# Patient Record
Sex: Male | Born: 1935 | Race: White | Hispanic: No | Smoking: Never smoker
Health system: Southern US, Community
[De-identification: ages and names within clinical notes are randomized; demographics above are authoritative.]

## PROBLEM LIST (undated history)

## (undated) DIAGNOSIS — I219 Acute myocardial infarction, unspecified: Secondary | ICD-10-CM

## (undated) DIAGNOSIS — E785 Hyperlipidemia, unspecified: Secondary | ICD-10-CM

## (undated) DIAGNOSIS — Z8042 Family history of malignant neoplasm of prostate: Secondary | ICD-10-CM

## (undated) DIAGNOSIS — Z951 Presence of aortocoronary bypass graft: Secondary | ICD-10-CM

## (undated) DIAGNOSIS — G4730 Sleep apnea, unspecified: Secondary | ICD-10-CM

## (undated) DIAGNOSIS — J449 Chronic obstructive pulmonary disease, unspecified: Secondary | ICD-10-CM

## (undated) DIAGNOSIS — C140 Malignant neoplasm of pharynx, unspecified: Secondary | ICD-10-CM

## (undated) HISTORY — PX: ESOPHAGUS SURGERY: SHX626

## (undated) HISTORY — PX: BACK SURGERY: SHX140

## (undated) HISTORY — DX: Malignant neoplasm of pharynx, unspecified: C14.0

## (undated) HISTORY — PX: CORONARY ARTERY BYPASS GRAFT: SHX141

## (undated) HISTORY — DX: Hyperlipidemia, unspecified: E78.5

## (undated) HISTORY — DX: Acute myocardial infarction, unspecified: I21.9

## (undated) HISTORY — DX: Chronic obstructive pulmonary disease, unspecified: J44.9

## (undated) HISTORY — PX: OTHER SURGICAL HISTORY: SHX169

## (undated) HISTORY — DX: Family history of malignant neoplasm of prostate: Z80.42

## (undated) HISTORY — DX: Sleep apnea, unspecified: G47.30

## (undated) HISTORY — DX: Presence of aortocoronary bypass graft: Z95.1

---

## 1999-09-03 ENCOUNTER — Ambulatory Visit (HOSPITAL_COMMUNITY): Admission: RE | Admit: 1999-09-03 | Discharge: 1999-09-03 | Payer: Self-pay | Admitting: Cardiology

## 2002-05-20 ENCOUNTER — Ambulatory Visit (HOSPITAL_BASED_OUTPATIENT_CLINIC_OR_DEPARTMENT_OTHER): Admission: RE | Admit: 2002-05-20 | Discharge: 2002-05-20 | Payer: Self-pay | Admitting: Neurology

## 2002-08-03 ENCOUNTER — Ambulatory Visit (HOSPITAL_BASED_OUTPATIENT_CLINIC_OR_DEPARTMENT_OTHER): Admission: RE | Admit: 2002-08-03 | Discharge: 2002-08-03 | Payer: Self-pay | Admitting: Neurology

## 2002-12-03 ENCOUNTER — Ambulatory Visit (HOSPITAL_COMMUNITY): Admission: RE | Admit: 2002-12-03 | Discharge: 2002-12-03 | Payer: Self-pay | Admitting: *Deleted

## 2002-12-03 ENCOUNTER — Encounter (INDEPENDENT_AMBULATORY_CARE_PROVIDER_SITE_OTHER): Payer: Self-pay | Admitting: Specialist

## 2003-12-15 ENCOUNTER — Ambulatory Visit (HOSPITAL_COMMUNITY): Admission: RE | Admit: 2003-12-15 | Discharge: 2003-12-15 | Payer: Self-pay | Admitting: *Deleted

## 2003-12-15 ENCOUNTER — Encounter (INDEPENDENT_AMBULATORY_CARE_PROVIDER_SITE_OTHER): Payer: Self-pay | Admitting: *Deleted

## 2004-06-30 ENCOUNTER — Encounter
Admission: RE | Admit: 2004-06-30 | Discharge: 2004-06-30 | Payer: Self-pay | Admitting: Physical Medicine and Rehabilitation

## 2004-08-22 ENCOUNTER — Inpatient Hospital Stay (HOSPITAL_COMMUNITY): Admission: RE | Admit: 2004-08-22 | Discharge: 2004-08-23 | Payer: Self-pay | Admitting: Orthopaedic Surgery

## 2006-01-22 ENCOUNTER — Ambulatory Visit (HOSPITAL_COMMUNITY): Admission: RE | Admit: 2006-01-22 | Discharge: 2006-01-22 | Payer: Self-pay | Admitting: *Deleted

## 2006-01-22 ENCOUNTER — Encounter (INDEPENDENT_AMBULATORY_CARE_PROVIDER_SITE_OTHER): Payer: Self-pay | Admitting: Specialist

## 2006-07-09 ENCOUNTER — Ambulatory Visit (HOSPITAL_COMMUNITY): Admission: RE | Admit: 2006-07-09 | Discharge: 2006-07-09 | Payer: Self-pay | Admitting: *Deleted

## 2006-07-09 ENCOUNTER — Encounter (INDEPENDENT_AMBULATORY_CARE_PROVIDER_SITE_OTHER): Payer: Self-pay | Admitting: Specialist

## 2006-07-25 ENCOUNTER — Encounter: Admission: RE | Admit: 2006-07-25 | Discharge: 2006-07-25 | Payer: Self-pay | Admitting: *Deleted

## 2006-07-31 ENCOUNTER — Ambulatory Visit (HOSPITAL_COMMUNITY): Admission: RE | Admit: 2006-07-31 | Discharge: 2006-07-31 | Payer: Self-pay | Admitting: Gastroenterology

## 2006-08-04 ENCOUNTER — Ambulatory Visit: Payer: Self-pay | Admitting: Gastroenterology

## 2006-08-05 ENCOUNTER — Ambulatory Visit: Payer: Self-pay | Admitting: Thoracic Surgery

## 2006-08-19 ENCOUNTER — Ambulatory Visit: Admission: RE | Admit: 2006-08-19 | Discharge: 2006-08-19 | Payer: Self-pay | Admitting: Thoracic Surgery

## 2006-08-20 ENCOUNTER — Ambulatory Visit (HOSPITAL_COMMUNITY): Admission: RE | Admit: 2006-08-20 | Discharge: 2006-08-20 | Payer: Self-pay | Admitting: Thoracic Surgery

## 2006-08-20 ENCOUNTER — Ambulatory Visit: Payer: Self-pay | Admitting: Thoracic Surgery

## 2006-08-24 ENCOUNTER — Inpatient Hospital Stay (HOSPITAL_COMMUNITY): Admission: RE | Admit: 2006-08-24 | Discharge: 2006-09-23 | Payer: Self-pay | Admitting: Thoracic Surgery

## 2006-08-25 ENCOUNTER — Encounter (INDEPENDENT_AMBULATORY_CARE_PROVIDER_SITE_OTHER): Payer: Self-pay | Admitting: Specialist

## 2006-08-25 ENCOUNTER — Ambulatory Visit: Payer: Self-pay | Admitting: Thoracic Surgery

## 2006-09-01 ENCOUNTER — Encounter (INDEPENDENT_AMBULATORY_CARE_PROVIDER_SITE_OTHER): Payer: Self-pay | Admitting: Specialist

## 2006-10-09 ENCOUNTER — Ambulatory Visit: Payer: Self-pay | Admitting: Thoracic Surgery

## 2006-10-09 ENCOUNTER — Encounter: Admission: RE | Admit: 2006-10-09 | Discharge: 2006-10-09 | Payer: Self-pay | Admitting: Thoracic Surgery

## 2006-10-10 ENCOUNTER — Encounter: Admission: RE | Admit: 2006-10-10 | Discharge: 2006-10-10 | Payer: Self-pay | Admitting: Thoracic Surgery

## 2006-10-15 ENCOUNTER — Ambulatory Visit: Payer: Self-pay | Admitting: Thoracic Surgery

## 2006-11-04 ENCOUNTER — Ambulatory Visit: Payer: Self-pay | Admitting: Thoracic Surgery

## 2006-11-19 ENCOUNTER — Encounter: Admission: RE | Admit: 2006-11-19 | Discharge: 2006-11-19 | Payer: Self-pay | Admitting: *Deleted

## 2006-11-28 ENCOUNTER — Ambulatory Visit: Payer: Self-pay | Admitting: Thoracic Surgery

## 2006-11-28 ENCOUNTER — Encounter: Admission: RE | Admit: 2006-11-28 | Discharge: 2006-11-28 | Payer: Self-pay | Admitting: Thoracic Surgery

## 2006-12-03 ENCOUNTER — Ambulatory Visit (HOSPITAL_COMMUNITY): Admission: RE | Admit: 2006-12-03 | Discharge: 2006-12-03 | Payer: Self-pay | Admitting: *Deleted

## 2006-12-09 ENCOUNTER — Ambulatory Visit (HOSPITAL_COMMUNITY): Admission: RE | Admit: 2006-12-09 | Discharge: 2006-12-09 | Payer: Self-pay | Admitting: *Deleted

## 2006-12-09 ENCOUNTER — Encounter (INDEPENDENT_AMBULATORY_CARE_PROVIDER_SITE_OTHER): Payer: Self-pay | Admitting: *Deleted

## 2006-12-30 ENCOUNTER — Ambulatory Visit: Payer: Self-pay | Admitting: Thoracic Surgery

## 2007-03-10 ENCOUNTER — Ambulatory Visit: Payer: Self-pay | Admitting: Thoracic Surgery

## 2007-06-10 ENCOUNTER — Ambulatory Visit: Payer: Self-pay | Admitting: Thoracic Surgery

## 2007-06-10 ENCOUNTER — Encounter: Admission: RE | Admit: 2007-06-10 | Discharge: 2007-06-10 | Payer: Self-pay | Admitting: Thoracic Surgery

## 2007-06-11 ENCOUNTER — Inpatient Hospital Stay (HOSPITAL_COMMUNITY): Admission: EM | Admit: 2007-06-11 | Discharge: 2007-06-16 | Payer: Self-pay | Admitting: Emergency Medicine

## 2007-07-15 ENCOUNTER — Ambulatory Visit (HOSPITAL_BASED_OUTPATIENT_CLINIC_OR_DEPARTMENT_OTHER): Admission: RE | Admit: 2007-07-15 | Discharge: 2007-07-15 | Payer: Self-pay | Admitting: Urology

## 2007-12-08 ENCOUNTER — Encounter: Admission: RE | Admit: 2007-12-08 | Discharge: 2007-12-08 | Payer: Self-pay | Admitting: Thoracic Surgery

## 2007-12-08 ENCOUNTER — Ambulatory Visit: Payer: Self-pay | Admitting: Thoracic Surgery

## 2008-03-10 ENCOUNTER — Encounter (INDEPENDENT_AMBULATORY_CARE_PROVIDER_SITE_OTHER): Payer: Self-pay | Admitting: *Deleted

## 2008-03-10 ENCOUNTER — Ambulatory Visit (HOSPITAL_COMMUNITY): Admission: RE | Admit: 2008-03-10 | Discharge: 2008-03-10 | Payer: Self-pay | Admitting: *Deleted

## 2008-04-29 HISTORY — PX: HERNIA REPAIR: SHX51

## 2008-05-16 ENCOUNTER — Encounter (INDEPENDENT_AMBULATORY_CARE_PROVIDER_SITE_OTHER): Payer: Self-pay | Admitting: General Surgery

## 2008-05-16 ENCOUNTER — Ambulatory Visit (HOSPITAL_COMMUNITY): Admission: RE | Admit: 2008-05-16 | Discharge: 2008-05-17 | Payer: Self-pay | Admitting: General Surgery

## 2008-06-08 ENCOUNTER — Encounter: Admission: RE | Admit: 2008-06-08 | Discharge: 2008-06-08 | Payer: Self-pay | Admitting: Thoracic Surgery

## 2008-06-08 ENCOUNTER — Ambulatory Visit: Payer: Self-pay | Admitting: Thoracic Surgery

## 2008-12-07 ENCOUNTER — Encounter: Admission: RE | Admit: 2008-12-07 | Discharge: 2008-12-07 | Payer: Self-pay | Admitting: Thoracic Surgery

## 2008-12-07 ENCOUNTER — Ambulatory Visit: Payer: Self-pay | Admitting: Thoracic Surgery

## 2009-05-15 IMAGING — CR DG CHEST 1V PORT
1 series · 1 of 1 positions shown · non-contrast
Comparison: 09/10/06.

CLINICAL DATA: Esophageal carcinoma.  Status post surgery.
 PORTABLE CHEST - 1 VIEW - 09/11/06:

[view not recorded]
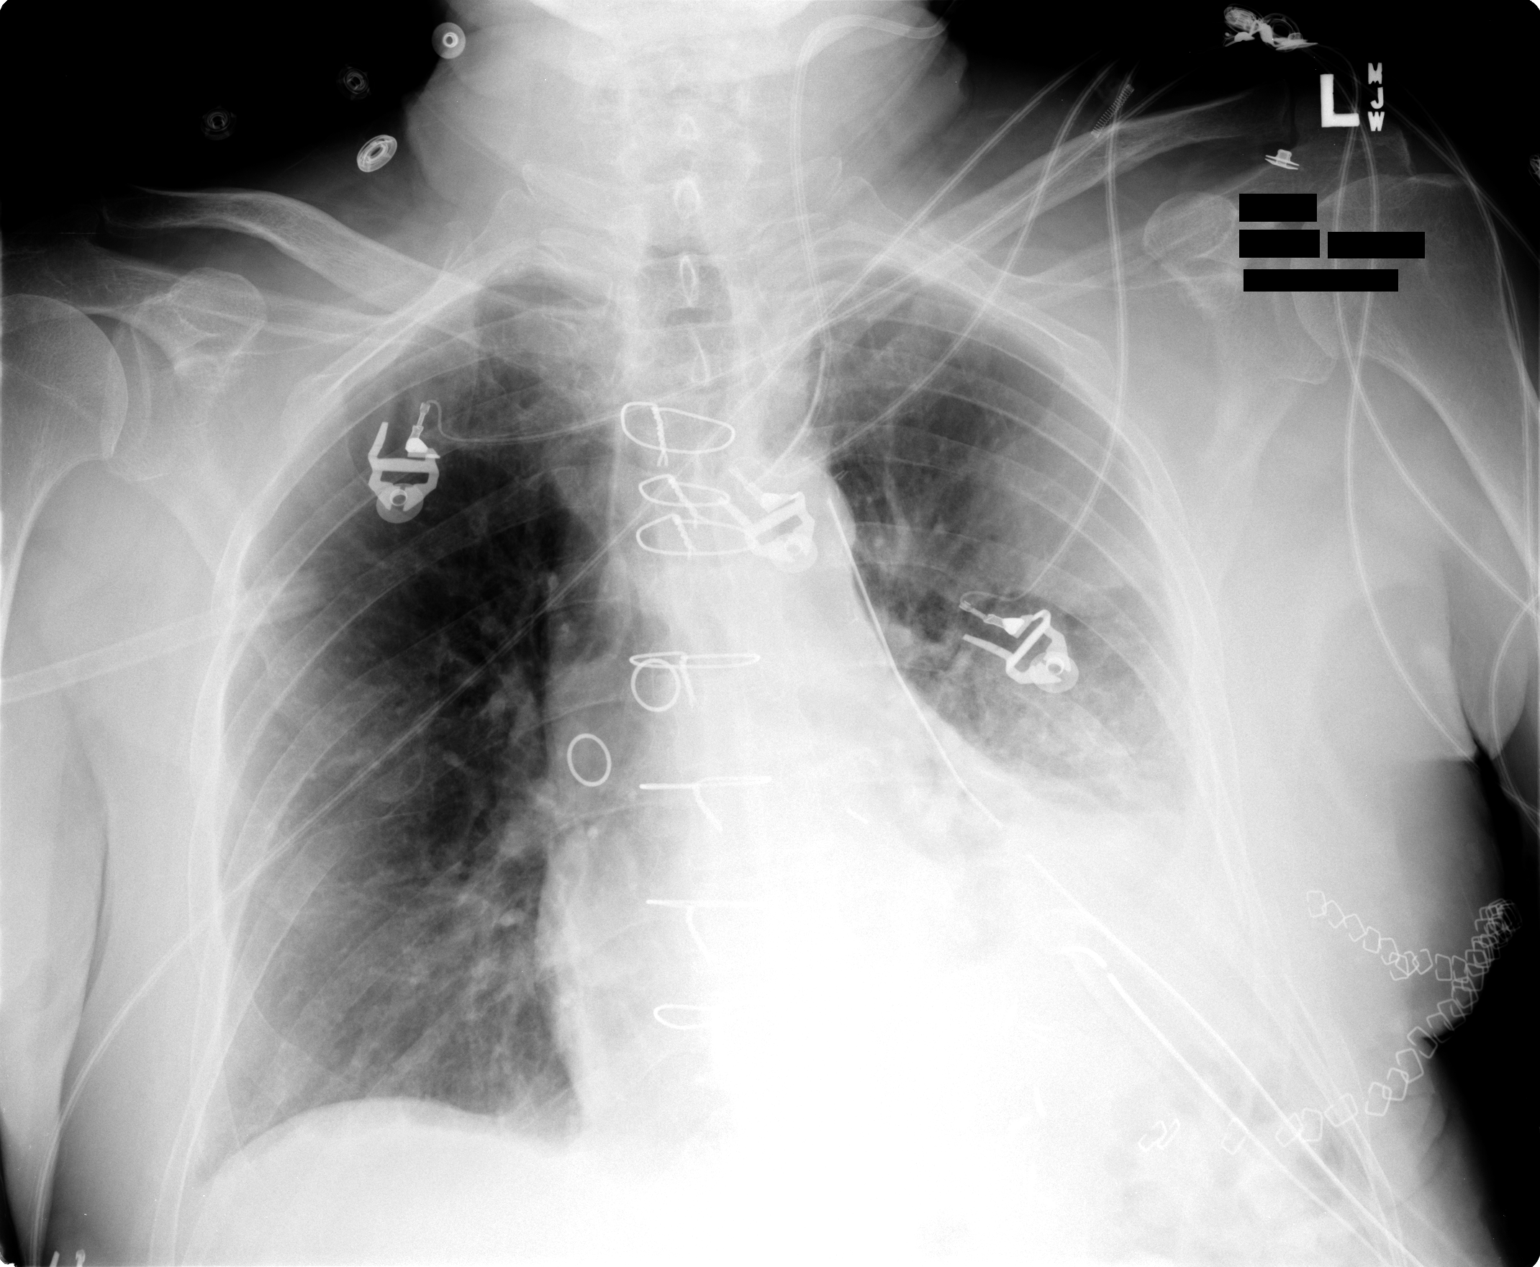

[1 of 1 positions shown; findings below may reference images not displayed]

FINDINGS: Two left thoracostomy tubes, left lower lung consolidation/atelectasis, vascular congestion, cardiomegaly, and left IJ central venous catheter are stable.  Slightly improved aeration is identified. There is no evidence of pneumothorax.
IMPRESSION: Slightly improved aeration?otherwise stable chest.

## 2009-05-18 IMAGING — CR DG CHEST 1V PORT
1 series · 1 of 1 positions shown · non-contrast
Comparison: 09/11/06.

CLINICAL DATA: Status esophagogastrectomy for esophageal carcinoma.
 PORTABLE CHEST ? 1 VIEW ? 09/14/06 AT 2452 HOURS:

[view not recorded]
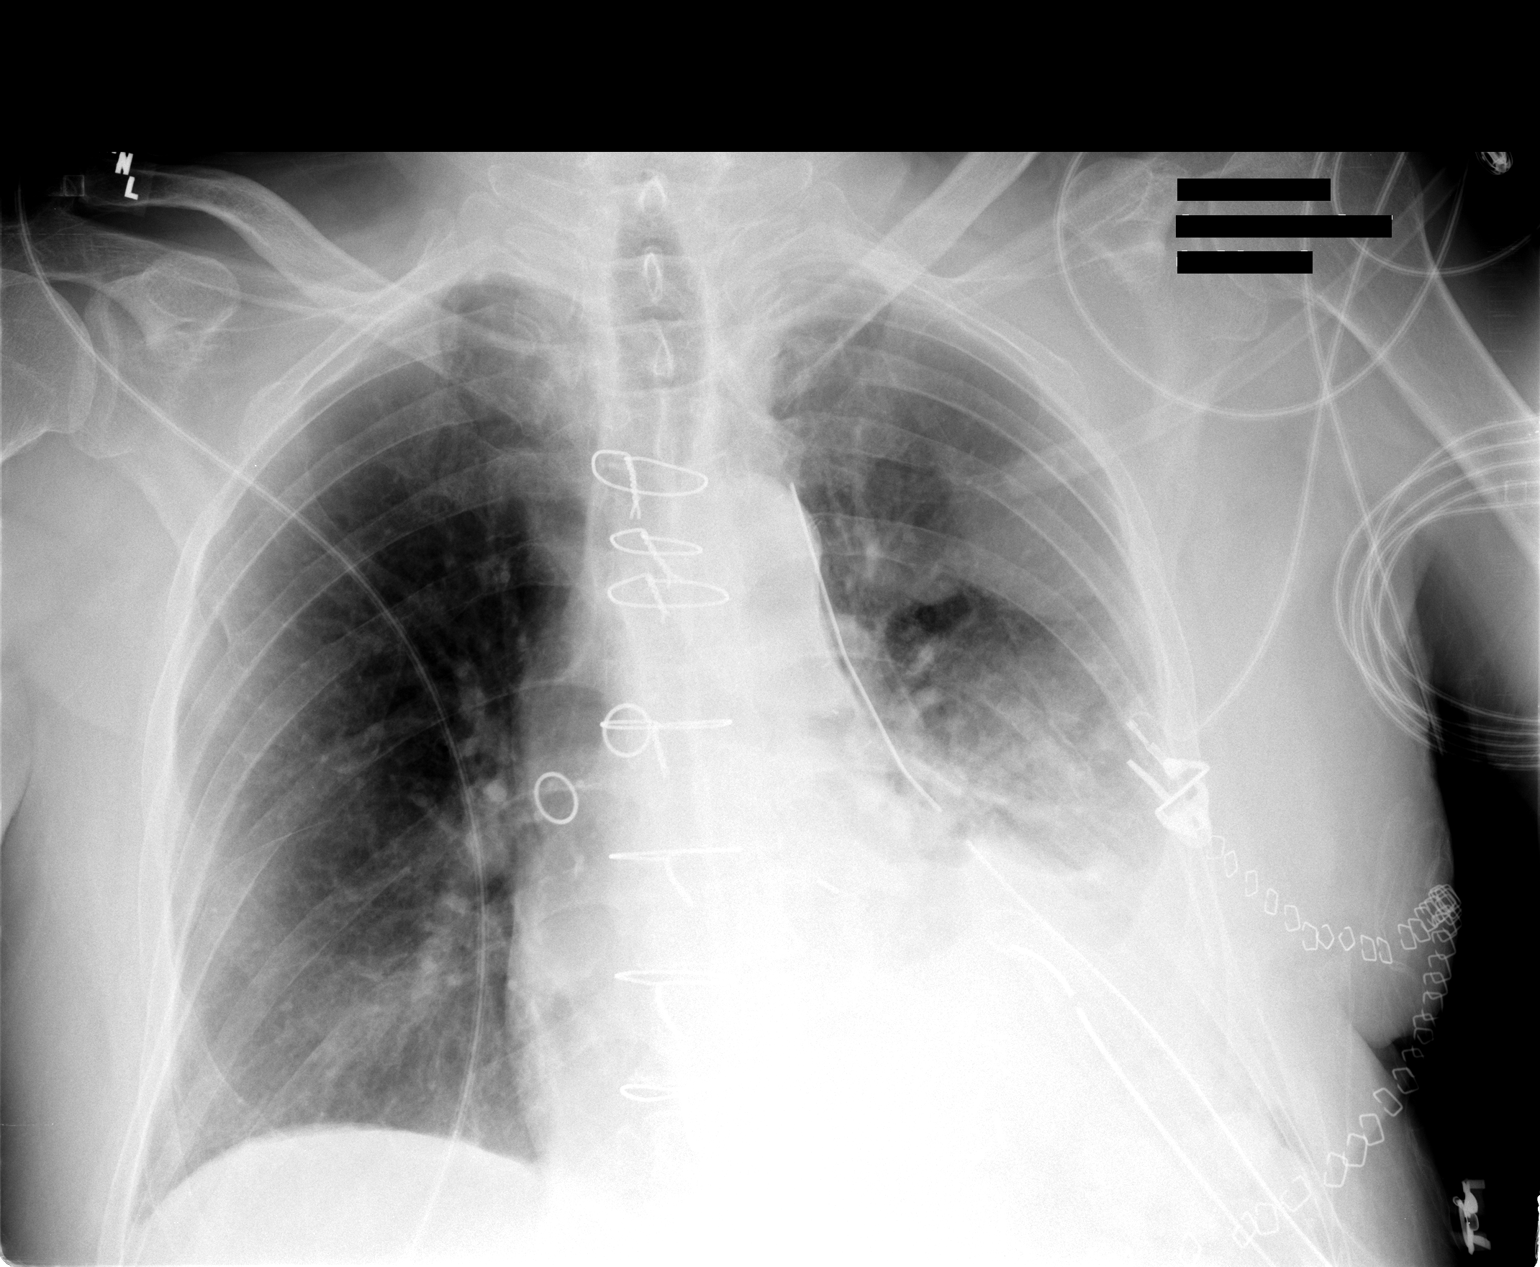

[1 of 1 positions shown; findings below may reference images not displayed]

FINDINGS: Two left-sided chest tubes remain in place postoperatively.  Degree of left lower lobe atelectasis and consolidation stable.  No pneumothorax is seen.  No edema.  Stable heart size.
IMPRESSION: Stable postoperative changes in the left chest with residual left lower lobe atelectasis and consolidation present.  No pneumothorax identified.

## 2009-05-20 IMAGING — CR DG CHEST 1V PORT
1 series · 1 of 1 positions shown · non-contrast
Comparison: none

HISTORY: Esophageal cancer status post esophagectomy

[view not recorded]
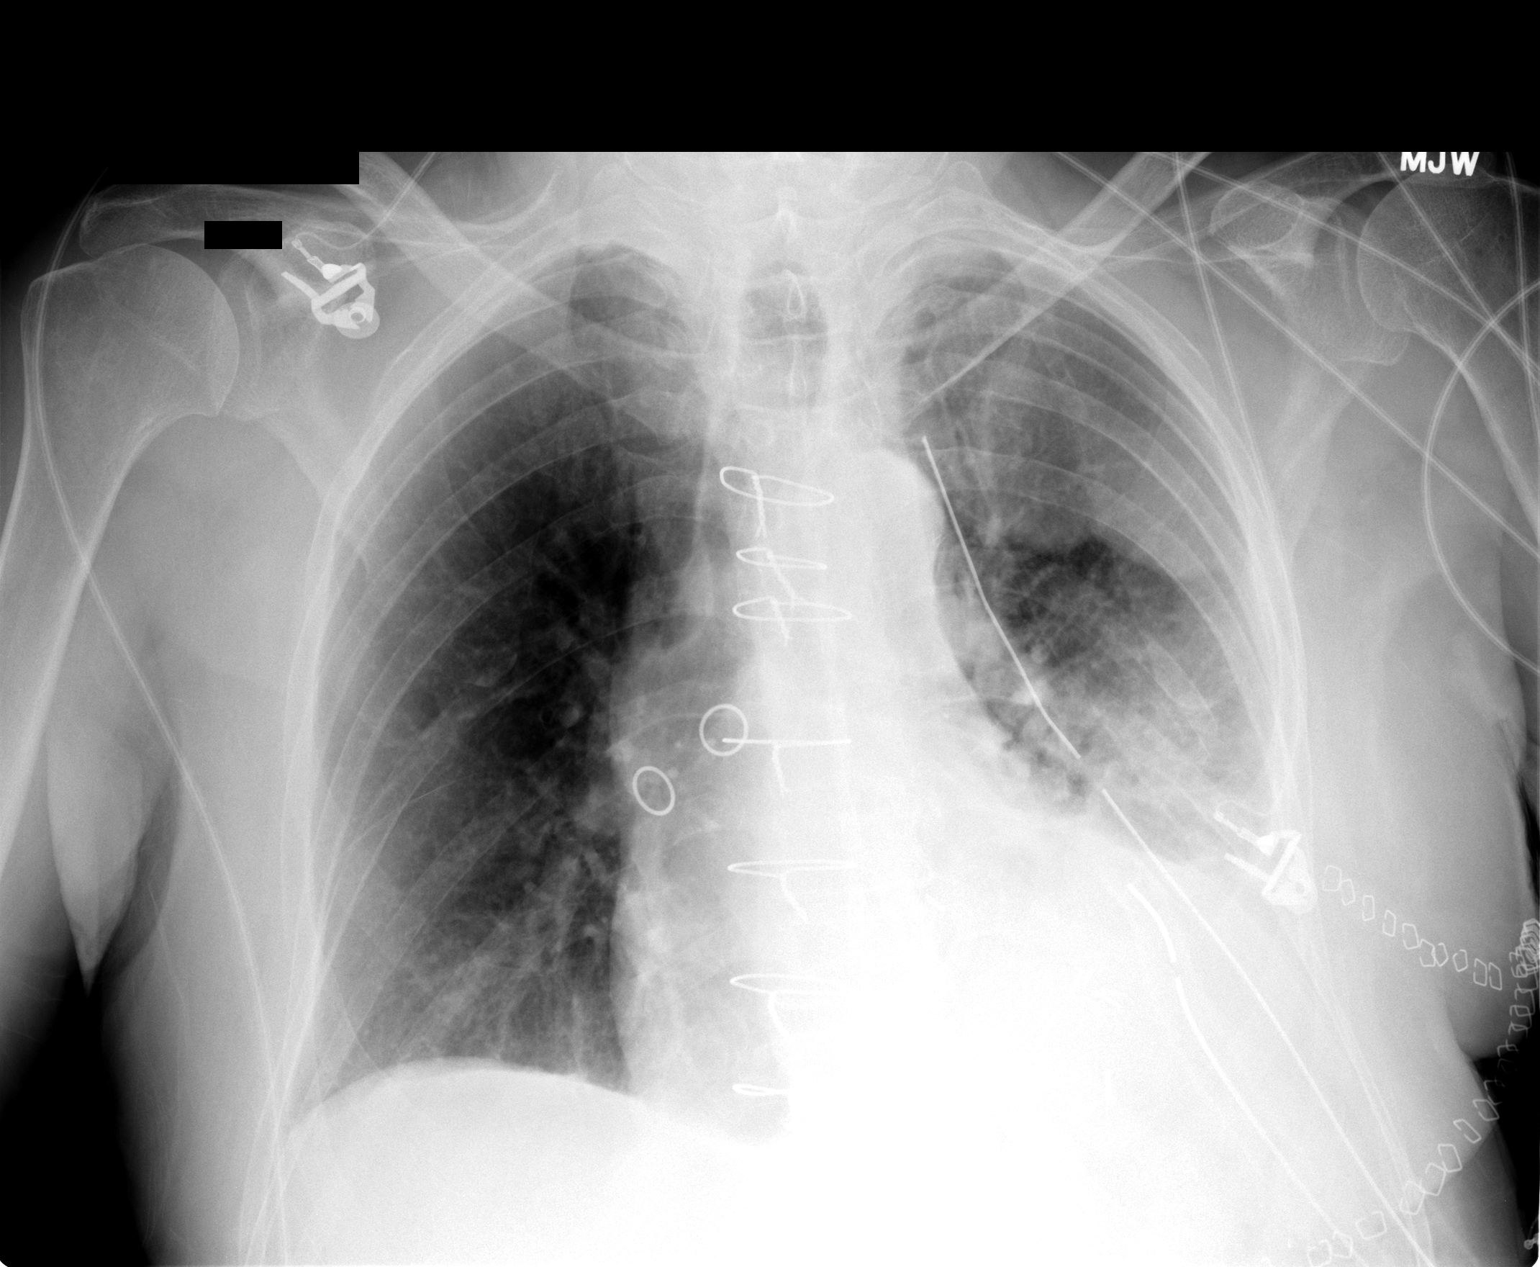

[1 of 1 positions shown; findings below may reference images not displayed]

PORTABLE CHEST ONE VIEW:

Portable exam 1541 hours compared to 09/14/2006

Two left thoracostomy tubes.
Cardiac enlargement status post CABG.
Persistent left lower lobe atelectasis versus consolidation.
Loculated pleural fluid at upper left hemithorax.
No pneumothorax.
Minimal linear atelectasis right lower lobe.
Remainder of right lung clear.
IMPRESSION: No significant interval change.

## 2009-05-21 IMAGING — CR DG CHEST 1V PORT
1 series · 1 of 1 positions shown · non-contrast
Comparison: 09/16/06.

CLINICAL DATA: Esophageal cancer. 
 PORTABLE CHEST ([DATE] HOURS):

[view not recorded]
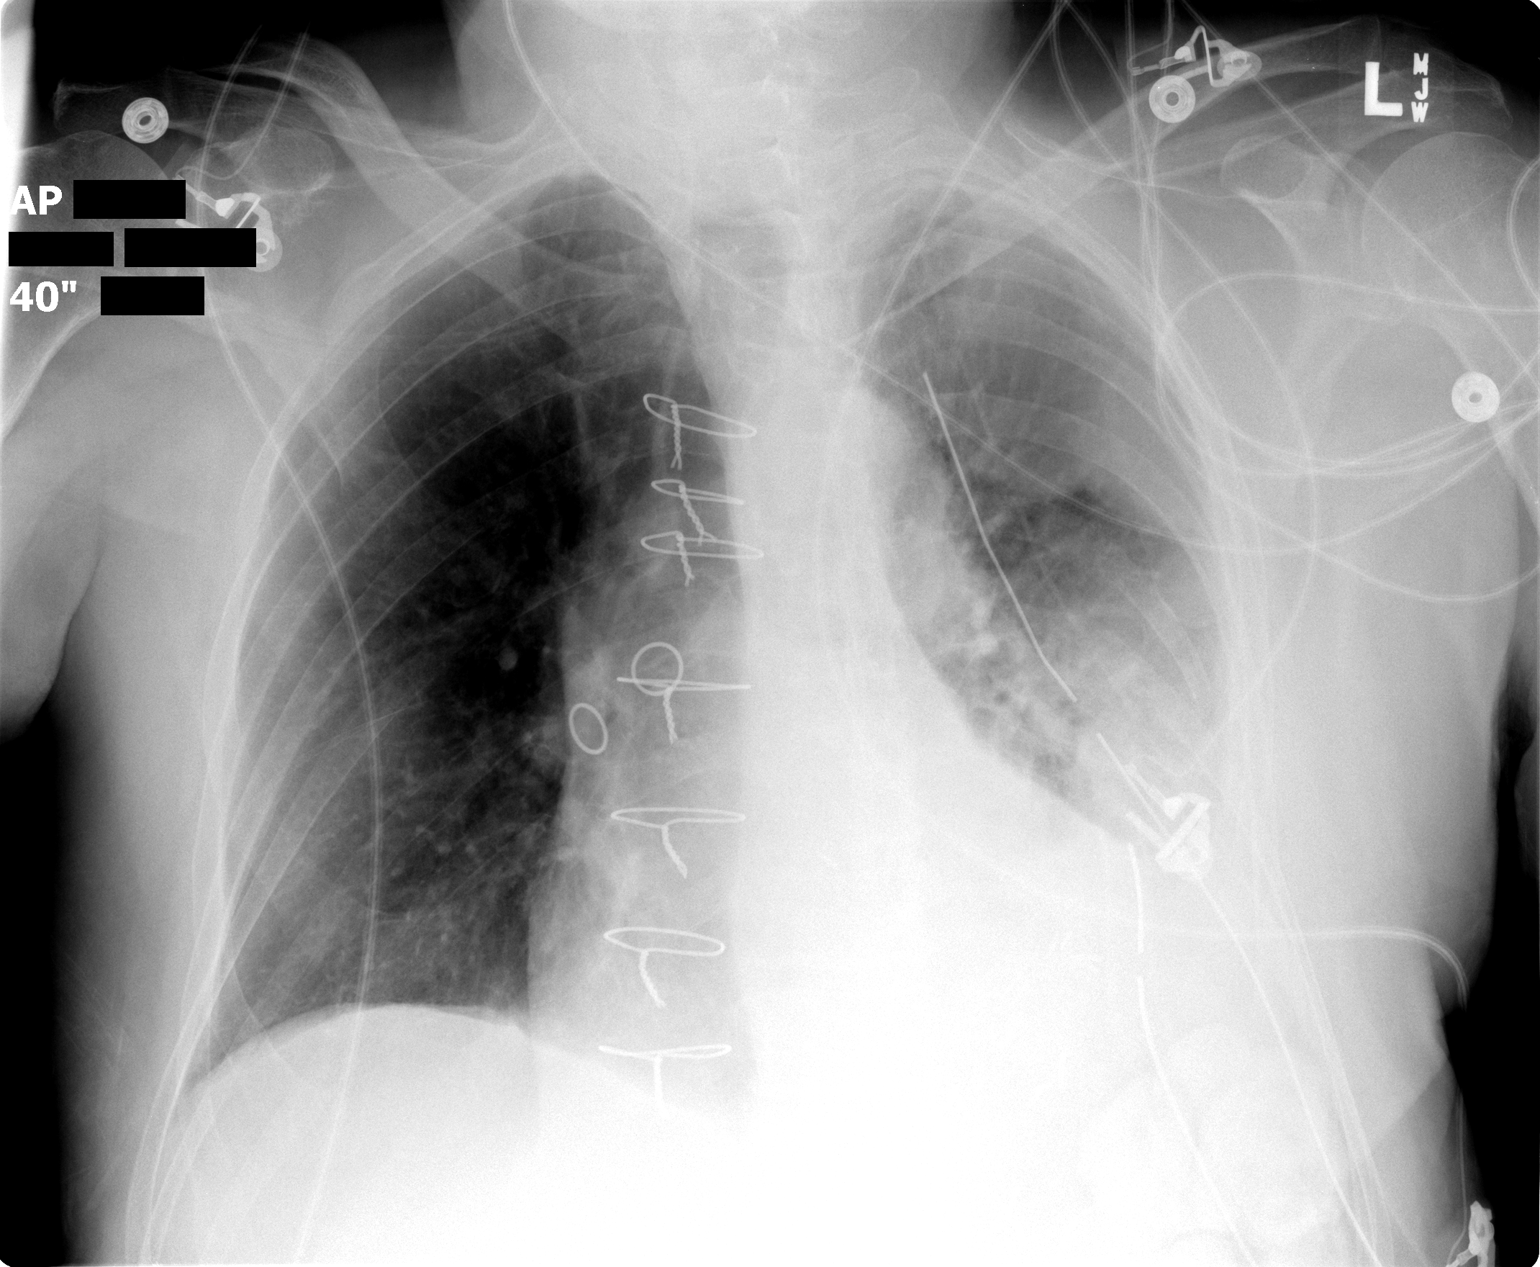

[1 of 1 positions shown; findings below may reference images not displayed]

FINDINGS: Two chest tubes remain on the left.  There is no pneumothorax.  Left lower lobe airspace disease and left effusion are again noted without significant change.  The right lung remains clear.  There is no edema.
IMPRESSION: No significant change in left lower lobe airspace disease and left effusion.

## 2009-05-22 IMAGING — CR DG CHEST 1V PORT
1 series · 1 of 1 positions shown · non-contrast
Comparison: 09/18/2006

CLINICAL DATA: Esophageal cancer, chest tube removal

PORTABLE CHEST - 1 VIEW:

[view not recorded]
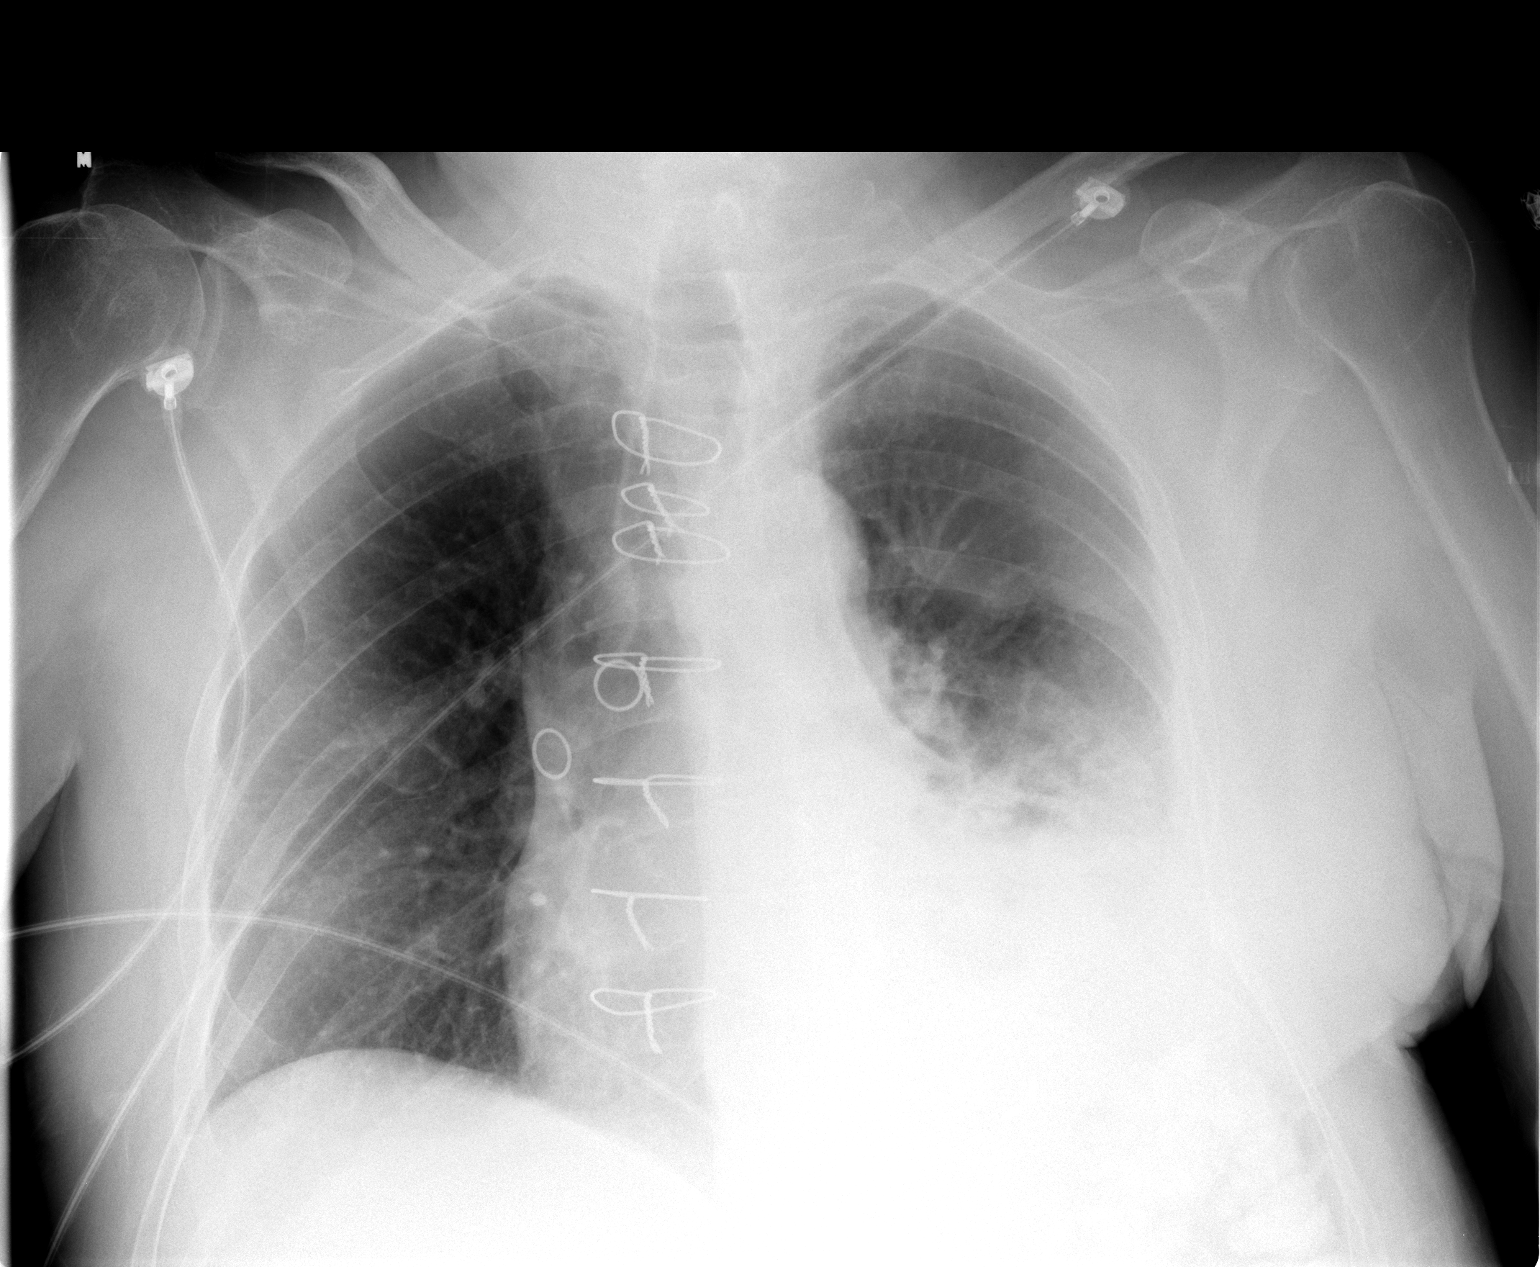

[1 of 1 positions shown; findings below may reference images not displayed]

FINDINGS: Interval removal of left basilar chest tube. No pneumothorax.
Continued left lower lobe opacity and probable left effusion, unchanged. Right
lung is clear.
IMPRESSION: Interval removal of left chest tube without pneumothorax. Otherwise no change.

## 2009-06-28 ENCOUNTER — Ambulatory Visit: Payer: Self-pay | Admitting: Thoracic Surgery

## 2009-06-28 ENCOUNTER — Encounter: Admission: RE | Admit: 2009-06-28 | Discharge: 2009-06-28 | Payer: Self-pay | Admitting: Thoracic Surgery

## 2010-01-02 ENCOUNTER — Encounter: Admission: RE | Admit: 2010-01-02 | Discharge: 2010-01-02 | Payer: Self-pay | Admitting: Thoracic Surgery

## 2010-01-02 ENCOUNTER — Ambulatory Visit: Payer: Self-pay | Admitting: Thoracic Surgery

## 2010-05-20 ENCOUNTER — Encounter: Payer: Self-pay | Admitting: *Deleted

## 2010-05-20 ENCOUNTER — Encounter: Payer: Self-pay | Admitting: Physical Medicine and Rehabilitation

## 2010-05-20 ENCOUNTER — Encounter: Payer: Self-pay | Admitting: Thoracic Surgery

## 2010-06-20 ENCOUNTER — Other Ambulatory Visit: Payer: Self-pay | Admitting: Thoracic Surgery

## 2010-06-20 DIAGNOSIS — R911 Solitary pulmonary nodule: Secondary | ICD-10-CM

## 2010-07-24 ENCOUNTER — Ambulatory Visit
Admission: RE | Admit: 2010-07-24 | Discharge: 2010-07-24 | Disposition: A | Discharge status: Home / Self Care | Payer: Medicare Other | Source: Ambulatory Visit | Attending: Thoracic Surgery | Admitting: Thoracic Surgery

## 2010-07-24 ENCOUNTER — Ambulatory Visit (INDEPENDENT_AMBULATORY_CARE_PROVIDER_SITE_OTHER): Payer: Medicare Other | Admitting: Thoracic Surgery

## 2010-07-24 DIAGNOSIS — R911 Solitary pulmonary nodule: Secondary | ICD-10-CM

## 2010-07-24 DIAGNOSIS — C159 Malignant neoplasm of esophagus, unspecified: Secondary | ICD-10-CM

## 2010-07-25 NOTE — Assessment & Plan Note (Unsigned)
OFFICE VISIT  Manuel Garner, Manuel Garner DOB:  09/03/1927                                        July 24, 2010 CHART #:  [id number]  The patient is nearly 4 years out following esophagectomy which was done in May 2008.  The patient was last seen in the office in September 2011. He presents back today for a 54-month followup appointment with a CT scan.  The patient is without complaints today.  He denies any fevers, nausea, vomiting, difficulty swallowing, coughing, shortness of breath. He is tolerating diet well.  He is up ambulating without difficulty.  PHYSICAL EXAMINATION:  VITAL SIGNS:  Blood pressure 94/56, pulse 84, respirations 14, O2 sats 96% on room air.  Weight of 150 pounds. RESPIRATORY:  Clear to auscultation bilaterally.  CARDIAC:  Regular rate and rhythm.  ABDOMEN:  Benign.  EXTREMITIES:  Warm and well-perfused.  STUDIES:  The patient had a CT scan done today which was evaluated by Dr. Edwyna Shell and no signs of recurrence noted.  IMPRESSION AND PLAN:  The patient is 4 years out following esophagectomy.  He continues to progress well.  CT scan shows no signs of recurrence.  The patient was seen and evaluated by Dr. Edwyna Shell.  At this time we will not reschedule him to come back and see Dr. Edwyna Shell due to his continued progression.  The patient is instructed of he has any questions or concerns, he is to contact us and we will see him again. The patient is in agreement.  Sol Blazing, PA  KMD/MEDQ  D:  07/24/2010  T:  07/25/2010  Job:  [id number]  cc:   Ines Bloomer, M.D.

## 2010-08-13 LAB — DIFFERENTIAL
Eosinophils Absolute: 0.1 10*3/uL (ref 0.0–0.7)
Eosinophils Relative: 2 % (ref 0–5)
Lymphs Abs: 2.3 10*3/uL (ref 0.7–4.0)
Monocytes Relative: 10 % (ref 3–12)

## 2010-08-13 LAB — COMPREHENSIVE METABOLIC PANEL
ALT: 17 U/L (ref 0–53)
AST: 20 U/L (ref 0–37)
Calcium: 9.4 mg/dL (ref 8.4–10.5)
GFR calc Af Amer: >60 mL/min (ref >60)
Sodium: 140 mEq/L (ref 135–145)
Total Protein: 6.2 g/dL (ref 6.0–8.3)

## 2010-08-13 LAB — CBC
MCHC: 33.5 g/dL (ref 30.0–36.0)
RBC: 4.65 MIL/uL (ref 4.22–5.81)
RDW: 13.2 % (ref 11.5–15.5)

## 2010-09-11 NOTE — Letter (Signed)
December 08, 2007   Georgiana Spinner, M.D.  [address]  [place], [place] [address]   Re:  QUEST, TAVENNER                 DOB:  [birth year]   Dear Greggory Stallion,   I saw the patient back in the office today.  His weight is stable at 156-  1/2 pounds.  His blood pressure is 132/87, pulse 68, respirations 18,  and sats are 96%.  He has had no problems with bleeding.  CT scan today  showed no evidence of recurrence of his cancer.  I will plan to see him  again in 6 months to repeat another CT scan.  He is now over a year  since his surgery.  After a rough start, he is doing much better.   Ines Bloomer, M.D.  Electronically Signed   DPB/MEDQ  D:  12/08/2007  T:  12/09/2007  Job:  [id number]

## 2010-09-11 NOTE — Letter (Signed)
October 15, 2006   Georgiana Spinner, M.D.  [address]  [place], [place] [address]   Re:  DUC, CROCKET                 DOB:  1928-03-12   Dear Greggory Stallion:   I saw Mr. Diemer in the office today.  We got a swallow him, and it  looks like he is emptying satisfactory.  He has had 2 small episodes of  vomiting since we saw him last, but his weight has gone from 160 to 164.  He is feeling much better.  We got a set of labs, and his potassium is  3.2, and we gave him some potassium for that.  His albumin was 2.9.  His  white count was 8.6 with a hemoglobin of 10.9, so I think he is making  progress.  With a satisfactory swallow and improving blood tests, I  think he is gradually getting better.  He is complaining of some  hemorrhoids, so I gave him Anusol suppository.  I plan to see him back  again in 3 weeks to check on his status.   Ines Bloomer, M.D.  Electronically Signed   DPB/MEDQ  D:  10/15/2006  T:  10/15/2006  Job:  [id number]

## 2010-09-11 NOTE — Letter (Signed)
November 28, 2006   Georgiana Spinner, M.D.  [address]  [place], [place] [address]   Re:  Manuel Garner, Manuel Garner                 DOB:  23-May-1927   Dear Greggory Stallion:   I saw Manuel Garner in the office today. The good news is that his weight  is up three pounds. He had a severe episode of nausea and vomiting last  Saturday, which he says that he vomited up dark looking material and had  dark looking stools. I noticed that you had gotten a CBC on him, but I  do not have those results. I plan to get another CBC just to make sure  that he is not anemic. He says that since that episode, his stools are  cleared and he has had no more nausea or vomiting. I saw the CT scan  that you obtained on him was essentially negative. I wonder if we still  should not do an upper endoscopy on him. I will give you a call to  discuss with you. I plan to see him back in four weeks with a chest x-  ray.   Ines Bloomer, M.D.  Electronically Signed   DPB/MEDQ  D:  11/28/2006  T:  11/29/2006  Job:  [id number]

## 2010-09-11 NOTE — Letter (Signed)
October 10, 2006   Georgiana Spinner, M.D.  [address]  [place], [place] [address]   Re:  WASSIM, KIRKSEY                 DOB:  08-13-1927   Dear Greggory Stallion:   I saw Mr. Pelc back in the office today.  His weight is down to 160  pounds.  His blood pressure is 119/67.  Pulse 93.  Respirations 18.  Saturations are 94%.  After his discharge, he had some problems with  vomiting, but this clear, and he was eating well until last night, when  he had another episode of vomiting.  He comes in today, and says he is a  little weak.  Chest x-ray shows the normal left chest changes, and  actually some mild clearing in his left base.  In the hospital, he  developed a delayed leak at 7 days, and we had to reoperate on him, and  closed the leak, and then put a flap in place.  He did well after this  surgery, and was sent home on liquids and postgastrectomy diet.  Today,  his lungs were clear.  His heart was regular sinus rhythm.  His  incisions were all well healed.  I am concerned about this weakness, as  well as his persistent vomiting.  For this reason, I am encouraged him  to get a barium swallow, which we will do in approximately 6 days, and a  CBC and CMP.  I did remove his jejunostomy tube, as this was  nonfunctional.  I hope that he gradually improves over the next several  days.  He will let us know if he has any further problems.   Ines Bloomer, M.D.  Electronically Signed   DPB/MEDQ  D:  10/10/2006  T:  10/11/2006  Job:  [id number]

## 2010-09-11 NOTE — Letter (Signed)
June 08, 2008   Georgiana Spinner, MD  [address]  [place], [place] [address]   Re:  Manuel Garner, Manuel Garner                 DOB:  1927-07-15   Dear Greggory Stallion,   The patient comes back approximately 2 years since his surgery.  His CT  scan shows no evidence of recurrence of his cancer.  There is some  dilated distal esophagus, which I think is the stomach where we did an  esophagogastrectomy.  His weight was 156, his blood pressure was 112/70,  pulse 74, respirations 18, and sats were 97%.  I will see him back again  in 6 months with a chest x-ray.   Sincerely,   Ines Bloomer, M.D.  Electronically Signed   DPB/MEDQ  D:  06/08/2008  T:  06/08/2008  Job:  [id number]

## 2010-09-11 NOTE — Letter (Signed)
June 10, 2007   Georgiana Spinner, M.D.  [address]  [place], [place] [address]   Re:  Manuel Garner, Manuel Garner                 DOB:  1927/07/15   Dear Greggory Stallion:   I saw Manuel Garner back in the office today. His weight is stable at 151.  He still has some occasional nausea and vomiting and some occasional  heartburn, but overall his abdominal status appears to be stable. He is  going to have some kidney stones removed by Dr. Earlene Plater.  On examination,  he complained of a bulge on his left inguinal area and he has a direct  hernia in his left groin. I have referred him to the general surgeons to  repair this. A CT scan today showed no evidence of recurrence of his  cancer so I will see him back again in six months and at that time I  will repeat his CT scan.   Ines Bloomer, M.D.  Electronically Signed   DPB/MEDQ  D:  06/10/2007  T:  06/11/2007  Job:  [id number]

## 2010-09-11 NOTE — Op Note (Signed)
NAME:  Manuel Garner, Manuel Garner NO.:  [id number]   MEDICAL RECORD NO.:  [id number]          PATIENT TYPE:  OIB   LOCATION:  1304                         FACILITY:  [facility]   PHYSICIAN:  Anselm Pancoast. Weatherly, M.D.DATE OF BIRTH:  November 13, 1927   DATE OF PROCEDURE:  05/16/2008  DATE OF DISCHARGE:                               OPERATIVE REPORT   PREOPERATIVE DIAGNOSIS:  Left inguinal hernia, direct, indirect.   POSTOPERATIVE DIAGNOSIS:  Left inguinal hernia, it is kind of a  pantaloon.   OPERATION:  Left inguinal herniorrhaphy with mesh reinforcement.   ANESTHESIA:  local with sedation.   HISTORY:  Manuel Garner is a 77 year old Caucasian male.  He is a cardiac  patient of Dr. Alanda Amass and he is also had esophageal cancer that Dr.  Edwyna Shell resected about a year ago.  He has done well and so far no  evidence of any recurring problems.  He has a left inguinal hernia and  possibly a little weakness on the right and was referred to me. I would  recommend that we repair the left inguinal hernia with an incision.  He  has also had cancer of the prostate, but with all the issues that he has  had from his esophageal resection and all, I thought it would be better  just to do it with local and sedation and not with the laparoscopic  approach, and the patient was in agreement with this and is here for the  planned procedure.  His electrolytes were all normal and he is on a baby  aspirin.  He has recently had a CT about 4 months ago that showed no  evidence of any tumor.  He does have stones within his kidneys , but  they are not symptomatic.   PROCEDURE:  The patient was taken to the operative suite.  The patient  was identified and a time-out was completed.  He has had a gram of Ancef  and we used a mixture of 0.5% Xylocaine plain and 0.25% Marcaine with  adrenaline, 50:50 ratios.  The patient was positioned on the OR table.  A mask was used with oxygen with a little bit of sedation and  then I  clipped the pubic area.  The left side had been marked preoperatively by  me and then we prepped him with Betadine solution and draped him in a  sterile manner.  The inguinal incision area was infiltrated with a  mixture of the solution and the iliac nerve area was blocked with a 22  gauge needle.  A total of about 28-30 mL of solution was used.  The  inguinal incision area was opened and there were 2 superficial veins  that were clamped, divided and ligated with 5-0 Vicryl and then the  external oblique aponeurosis was opened through the external ring.  The  cord structures were elevated and you could see that there was a  definite indirect component of this direct inguinal hernia and I  separated the lipoma and the indirect hernia sac.  When I opened the  sac, some omentum of the  colon was right there, but it was not truly a  slider and then a high sac ligation with 2-0 Vicryl was used and then a  second tie was placed just distal and the indirect hernia sac removed.  Next, I repaired the floor, suturing the lateral edge of the rectus  conjoined tendon area to the shelving edge of the inguinal ligament and  then after recreating the internal ring went down, tied the 2 ends  together with a running kind of a Shouldice-type repair.  Next,  the  mesh onlay Lichtenstein repair was used with a piece of Prolene mesh.  A  slit laterally was placed around the cord structures and the inferior  limb was sutured with a running 2-0 Prolene to the shelving edge of the  inguinal ligament and then the 2 tails were sutured together recreating  the internal ring.  The mesh was lying without excessive tension flat  and then the superior flap was sutured down over the rectus with  interrupted sutures of 2-0 Prolene also.  Next, I did put a little  additional Marcaine in the floor before putting the mesh on and then  used a little bit more suture or injected laterally to numb him up well   postoperatively.  Next, the Penrose drain was removed and the external  oblique was closed with a running 3-0 Vicryl and Benzoin.  The 3-0  Vicryl interrupted was used on the Scarpa's fascia, 4-0 Dexon on the  subcuticular and Benzoin and Steri-Strips on the skin.  The patient  tolerated the procedure nicely.  The testicle was in its normal  position.  The left testicle was a little smaller than the right  preoperatively.  We are planning to keep him overnight since he is on  such a list of cardiac medicines, but he should be able to be discharged  today.  I will continue all of his usual medications and we will use  Vicodin for postoperative pain.           ______________________________  Anselm Pancoast. Zachery Dakins, M.D.     WJW/MEDQ  D:  05/16/2008  T:  05/16/2008  Job:  [id number]   cc:   Ines Bloomer, M.D.  [address]  [place]  [place] [address]   Richard A. Alanda Amass, M.D.  Fax: [phone]

## 2010-09-11 NOTE — Letter (Signed)
November 04, 2006   Georgiana Spinner, M.D.  [address]  [place], [place] [address]   Re:  NARCISO, STOUTENBURG                 DOB:  1927-08-05   Dear Greggory Stallion:   I have asked Mr. Mizer to come back to see you.  He still has some  weight loss.  He has lost another 8 pounds.  He says his appetite is  fine.  He is eating okay, but if he eats too much he has some nausea and  vomiting and certain types of sweeter foods bother him also.  I would  like for you to see him and see if he needs a repeated endoscopy, to see  how everything is doing.  The swallow I got looks satisfactory.  I hope  that he will start gaining some weight or at least stabilize his weight.   I plan to see him back again in four weeks.  I would appreciate your  help in this matter.   Ines Bloomer, M.D.  Electronically Signed   DPB/MEDQ  D:  11/04/2006  T:  11/05/2006  Job:  [id number]

## 2010-09-11 NOTE — H&P (Signed)
NAMEKEONI, RISINGER NO.:  [id number]   MEDICAL RECORD NO.:  [id number]          PATIENT TYPE:  INP   LOCATION:  2037                         FACILITY:  [facility]   PHYSICIAN:  Richard A. Alanda Amass, M.D.DATE OF BIRTH:  08-31-1927   DATE OF ADMISSION:  06/11/2007  DATE OF DISCHARGE:                              HISTORY & PHYSICAL   CHIEF COMPLAINT:  Fatigue and dyspnea on exertion.   HISTORY OF PRESENT ILLNESS:  [name] is a pleasant 77 year old male  followed by Dr. Alanda Amass and Dr. Ricki Miller.  He has a history of coronary  disease.  He had bypass surgery in 1997 in [place].  He was  catheterized in 2007.  He had a patent LIMA to the LAD, patent SVG to  the diagonal, patent SVG to the OM-2 and a normal codominant RCA with  normal LV function.  Echocardiogram in 2008 showed moderate to severe  mitral valve calcification and some aortic valve sclerosis with good LV  function.  In April 2008, he was diagnosed adenocarcinoma of the  esophagus and had surgery by Dr. Edwyna Shell.  Since that time, he has had  some fatigue and dyspnea on exertion.  He saw Dr. Alanda Amass in the  office June 08, 2007.  Labs were ordered and on June 09, 2007,  his hemoglobin was 8.5.  He is admitted now through the emergency room  for further evaluation of possible GI bleeding.  The patient denies any  chest pain or any syncope.   PAST MEDICAL HISTORY:  Remarkable for:  1. Nephrolithiasis; he is actually scheduled to have a procedure next      week for nephrolithiasis by Dr. Earlene Plater.  2. He has sleep apnea and is on CPAP.  3. He has restless leg syndrome.  4. He has had a history of orthostatic hypotension and is on      midodrine.  5. He does have gastroesophageal reflux.  6. He has had remote prostate cancer and has had seed implants in the      past.  7. He has had prior back and shoulder surgery.   CURRENT MEDICATIONS:  1. Aspirin 81 mg a day.  2. Protonix 40 mg a day.  3.  Midodrine 2.5 mg b.i.d.  4. Mirapex 0.125 mg t.i.d.  5. Reglan 10 mg a.c. and nightly.  6. Lipitor 40 mg a day.   ALLERGIES:  He has no known drug allergies.   SOCIAL HISTORY:  He is married.  He has 2 children and 2 grandchildren.  He is a remote smoker.   REVIEW OF SYSTEMS:  Remarkable for prior back surgery.  He does wear  glasses.  He has not had angina recently.  He has not had orthopnea or  PND or lower extremity edema.   PHYSICAL EXAM:  VITAL SIGNS:  Blood pressure 150/80, pulse 74,  temperature 97.2.  GENERAL:  He is a well-developed pale male in no acute distress.  HEENT:  Normocephalic.  Extraocular movements are intact.  Sclerae are  anicteric.  NECK:  Without JVD or bruit.  CHEST:  Reveals diminished breath sounds at the left base, otherwise  clear lung fields.  CARDIAC:  Exam reveals regular rate and rhythm without 2/6 systolic  murmur at the left sternal border and apex that radiates to his left  axilla.  ABDOMEN:  Not distended.  He has a midline surgical scar.  EXTREMITIES:  Without edema.  He has good distal pulses.  NEUROLOGIC:  Exam is grossly intact.  He is awake, alert and oriented  and cooperative and moves all extremities without obvious deficit.  SKIN:  Warm and dry.  RECTAL:  Exam done in the emergency room reveals heme-positive dark  brown stool.   LABORATORY DATA:  Repeat labs are pending.   IMPRESSION:  1. Anemia and gastrointestinal bleeding.  2. Dyspnea secondary to #1.  3. Coronary disease, coronary artery bypass grafting in 1997 with      patent grafts, March 2007, and normal left ventricular function.  4. Mitral annular calcification and aortic valve sclerosis with normal      left ventricular function by echocardiogram in April 2008.  5. History of orthostatic hypotension, on midodrine.  6. History of esophageal cancer treated with surgery in April 2008 by      Dr. Edwyna Shell; he has been seen by Dr. Virginia Rochester.  7. Remote prostate cancer, treated  with seed implant.  8. Nephrolithiasis with recent renal colic, seen by Dr. Earlene Plater.   PLAN:  The patient will be admitted to telemetry.  We will repeat his  labs.  We will increase his PPI to b.i.d. and hold his aspirin.  He will  be transfused as needed.  Dr. Wende Neighbors office has been contacted.      Abelino Derrick, P.A.      Richard A. Alanda Amass, M.D.  Electronically Signed    LKK/MEDQ  D:  06/11/2007  T:  06/13/2007  Job:  [id number]

## 2010-09-11 NOTE — Assessment & Plan Note (Signed)
OFFICE VISIT   Manuel Garner, Manuel Garner  DOB:  06/06/1927                                        December 07, 2008  CHART #:  [id number]   .  The patient returned today, and he is over 2 years since his resection.  He is doing well.  His weight is stable.  His chest x-ray showed normal  postoperative changes.  Blood pressure is 100/59, pulse 79, respirations  18, and sats are 95%.  Lungs, clear to auscultation and percussion.  We  will see him back in 6 months with another CT scan.   Ines Bloomer, M.D.  Electronically Signed   DPB/MEDQ  D:  12/07/2008  T:  12/08/2008  Job:  [id number]

## 2010-09-11 NOTE — Op Note (Signed)
NAMEHERVE, HAUG NO.:  [id number]   MEDICAL RECORD NO.:  [id number]          PATIENT TYPE:  INP   LOCATION:  2037                         FACILITY:  [facility]   PHYSICIAN:  Georgiana Spinner, M.D.    DATE OF BIRTH:  Dec 02, 1927   DATE OF PROCEDURE:  06/15/2007  DATE OF DISCHARGE:                               OPERATIVE REPORT   PROCEDURE:  Colonoscopy.   INDICATIONS:  Iron-deficiency anemia, presumed GI blood loss.   ANESTHESIA:  Fentanyl 50 mcg, Versed 4 mg.   PROCEDURE:  With the patient mildly sedated in the left lateral  decubitus position, the Pentax videoscopic colonoscope was inserted in  the rectum, passed under direct vision to the cecum, identified by  ileocecal valve and appendiceal orifice both of which were photographed.  From this point the colonoscope was slowly withdrawn taking  circumferential views of colonic mucosa stopping in the rectum which  appeared normal on direct, showed hemorrhoids on retroflexed view.  The  endoscope was straightened and withdrawn.  The patient's vital signs,  pulse oximeter remained stable.  The patient tolerated procedure well  without apparent complications.   FINDINGS:  Internal hemorrhoids, otherwise unremarkable exam.   PLAN:  I discussed with the patient's wife follow-up as an outpatient.  Anticipate no further evaluation  The mild iron-deficiency may in fact  be related to his underlying esophagitis which was noted previously.  Continue b.i.d. PPI and advance diet as tolerated.           ______________________________  Georgiana Spinner, M.D.     GMO/MEDQ  D:  06/15/2007  T:  06/16/2007  Job:  [id number]

## 2010-09-11 NOTE — Letter (Signed)
March 10, 2007   Georgiana Spinner, M.D.  [address]  [place]  [place] [address]   Re:  COEN, MIYASATO                 DOB:  Oct 06, 1927   Dear Greggory Stallion,   I saw Mr. Sia  in the office today.  His weight was 151 which is  stable.  His blood pressure was 111/59, pulse 88, respirations 18,  saturations are 96%.  He is eating much better and has had no more  nausea or vomiting.  He is on Reglan 10 mg a day, as well as Protonix.  I think his situation is stabilized.  Hopefully, he will actively start  gaining some weight.  Plan to see him back again in three to four months  and will repeat his CT scan at that time.   Sincerely,   Ines Bloomer, M.D.  Electronically Signed   DPB/MEDQ  D:  03/10/2007  T:  03/11/2007  Job:  [id number]

## 2010-09-11 NOTE — Op Note (Signed)
NAME:  Manuel Garner, NESMITH NO.:  [id number]   MEDICAL RECORD NO.:  [id number]          PATIENT TYPE:  AMB   LOCATION:  ENDO                         FACILITY:  [facility]   PHYSICIAN:  Georgiana Spinner, M.D.    DATE OF BIRTH:  August 11, 1927   DATE OF PROCEDURE:  DATE OF DISCHARGE:                               OPERATIVE REPORT   PROCEDURE:  Upper endoscopy with biopsy.   INDICATIONS:  Barrett's esophagus with history of esophageal cancer and  continued reflux.   ANESTHESIA:  Fentanyl 37.5 mcg, Versed 3.5 mg.   DESCRIPTION OF PROCEDURE:  With the patient mildly sedated in the left  lateral decubitus position, the Pentax videoscopic endoscope was  inserted in the mouth and passed under direct vision through the  esophagus where changes of esophagitis were seen with ulcerations  extending up the esophagus.  Photographs were taken.  There was also one  spot of Barrett's residual, which I photographed and biopsied as well.  I then entered into the stomach.  The fundus, body, antrum, duodenal  bulb and second portion of the duodenum were visualized.  From this  point, the endoscope was slowly withdrawn, taking circumferential views  of the duodenal mucosa until the endoscope had been pulled back into  stomach and placed in retroflexion to view the stomach from below.  A  widely patent GE junction was noted.  The endoscope was straightened and  withdrawn, taking circumferential views of the remaining gastric and  esophageal mucosa.  The patient's vital signs and pulse oximeter  remained stable.  The patient tolerated the procedure well with no  apparent complication.   FINDINGS:  Barrett's esophagus and esophagitis noted with a widely  patent GE junction.   PLAN:  Await biopsy report.  I will have the patient follow-up with me  as an outpatient to discuss possibilities.           ______________________________  Georgiana Spinner, M.D.     GMO/MEDQ  D:  03/10/2008  T:   03/10/2008  Job:  [id number]

## 2010-09-11 NOTE — Discharge Summary (Signed)
[name], [name] NO.:  [id number]   MEDICAL RECORD NO.:  [id number]          PATIENT TYPE:  INP   LOCATION:  2037                         FACILITY:  [facility]   PHYSICIAN:  Richard A. Alanda Amass, M.D.DATE OF BIRTH:  Sep 26, 1927   DATE OF ADMISSION:  06/11/2007  DATE OF DISCHARGE:  06/16/2007                               DISCHARGE SUMMARY   [name] is a 77 year old white married male patient of Dr. Susa Griffins, who is status post CABG in 1997 in [facility].  He had a  catheterization in May 2007.  He had patent grafts with normal LV  function.  He had a cancer of the esophagus April 2008 treated with  surgery.  He has been recently complaining about increased fatigue and  dyspnea on exertion.  He was seen by Dr. Alanda Amass in the office.  Labs  were drawn.  His hemoglobin was down to 8.5 and he was told to come to  the emergency room.  Here his hemoglobin was repeated.  It was initially  9.5.  He was not transfused with any blood.  The following day it was  down to 8.0.  His hematocrit was 24.7.  Thus, he got 2 units of packed  red blood cells.  He was seen by GI, Dr. Virginia Rochester.  He was noted to have iron  deficiency.  It was decided that he should undergo colonoscopy.  His  hemoglobin came up to 10.6, hematocrit 32.3.  He underwent colonoscopy.  He had some hemorrhoids but Dr. Virginia Rochester felt that his GI blood loss was from  esophagitis.  Thus, he recommended continuing his b.i.d. PPI and  continuing his iron.  On June 16, 2007, he was considered stable for  discharge home.   LABS:  June 15, 2007, his hemoglobin was 10.4, hematocrit 31.6, WBC  7.6 and platelets were 164.  June 12, 2007, his sodium was 141,  potassium 3.6, BUN 18, creatinine 1.03, chloride 109, CO2 27.  Folate  was greater than 20.  Vitamin B12 was 445.  TSH was 1.562.  Iron was 15,  TIBC was 432, percent saturation was 3.  BNP was 303.  D-dimer was 0.45.  Magnesium was 2.2.  Troponin was  negative.  I do not see a chest x-ray  in the chart and in the computer at this time.  He did have a CT of the  chest without contrast on June 10, 2007, that showed postoperative  changes of esophagectomy and a and gastric pull-through with evolving  pleural parenchymal scarring in the left lower hemithorax, no evidence  of metastatic disease.  He had bilateral nephrolithiasis, lesions  contained in the right kidney, difficult to characterize, and  cholelithiasis.   DISCHARGE MEDICATIONS:  1. Protonix 40 mg twice a day.  2. Midodrine 2.5 mg twice a day.  Second dose at least 4 hours before      bedtime.  3. Mirapex 0.125 mg two times per day.  4. Lipitor 40 mg a day.  5. Reglan 10 mg before meals and at bedtime.  6. Ferrous sulfate  325 mg twice a day.   He is to have a 2-D echo in our office today at 4 p.m. on the second  floor and he will have a urological stone removal with Dr. Earlene Plater on  June 19, 2007.  He will follow up with Dr. Alanda Amass on July 10, 2007, at 10:45.  he should continue his regular diet and he should have  no activity restrictions.   DISCHARGE DIAGNOSES:  1. His anemia, multifactorial, blood loss and low iron.  He did have a      positive guaiac stool.  He underwent colonoscopy this      hospitalization and he had some hemorrhoids.  Dr. were felt that      his anemia may be due to his gastritis.  Thus he recommended twice-      daily proton pump inhibitor.  2. Iron-deficient anemia.  Iron stores low by labs.  3. History of coronary artery disease, status post coronary artery      bypass graft in 1997, catheterization was okay in 2007.  4. Shortness of breath and fatigue related to his anemia.  He did      receive 2 units of packed red blood cells this during this      hospitalization.  5. Esophageal cancer, surgery April 2008.  6. Nephrolithiasis, to have procedure by Dr. Earlene Plater this week.  7. Cholelithiasis by a CT scan.      Lezlie Octave,  N.P.      Richard A. Alanda Amass, M.D.  Electronically Signed    BB/MEDQ  D:  06/16/2007  T:  06/17/2007  Job:  [id number]   cc:   Georgiana Spinner, M.D.  Juline Patch, M.D.  Ronald L. Earlene Plater, M.D.  Ines Bloomer, M.D.

## 2010-09-11 NOTE — Letter (Signed)
January 02, 2010   Massie Maroon, MD  [address]  [place] [address]   Re:  Manuel Garner, Manuel Garner                 DOB:  May 08, 1927   Dear Dr. Selena Batten:   I saw the patient back today.  He had a very severe problem in August in  which he was treated at [facility] for bronchitis that is now resolved.  We got a CT scan today that showed no evidence of recurrence of his  cancer and no pneumonia.  He has lost a little weight since he has had  the pneumonia, but overall is stable.  We only got a preliminary report,  and of course I will check the final report on that, but from my  standpoint he is doing reasonably well.  I will see him back again in 6  months with a CT scan.  His blood pressure was 120/73, pulse 74,  respirations 60, saturations were 97%.   Ines Bloomer, M.D.  Electronically Signed   DPB/MEDQ  D:  01/02/2010  T:  01/03/2010  Job:  [id number]

## 2010-09-11 NOTE — Op Note (Signed)
NAME:  Manuel Garner, Manuel Garner NO.:  [id number]   MEDICAL RECORD NO.:  [id number]          PATIENT TYPE:  AMB   LOCATION:  NESC                         FACILITY:  [facility]   PHYSICIAN:  Ronald L. Earlene Plater, M.D.  DATE OF BIRTH:  1927/05/16   DATE OF PROCEDURE:  07/15/2007  DATE OF DISCHARGE:                               OPERATIVE REPORT   PREOPERATIVE DIAGNOSES:  1. Bilateral nephrolithiasis.  2. Left ureterolithiasis.  3. Left hydronephrosis preoperatively.   POSTOPERATIVE DIAGNOSIS:  Bilateral nephrolithiasis with a left  hydronephrosis.   OPERATIVE PROCEDURES:  1. Cystourethroscopy.  2. Left ureteroscopy.   SURGEON:  Lucrezia Starch. Earlene Plater, M.D.   ANESTHESIA:  LMA.   ESTIMATED BLOOD LOSS:  Negligible.   TUBES:  None.   COMPLICATIONS:  None.   INDICATION FOR PROCEDURE:  [name] is a very nice 77 year old white  male with known bilateral nephrolithiasis.  He presented with left flank  pain, nausea and vomiting and on CT scan was found to have left  ureterolithiasis with hydronephrosis.  He continued to have intermittent  pain and was followed up for a plain x-ray in the office, which revealed  bilateral nephrolithiasis and a 6-mm stone in the left ureterovesical  junction with a 3.5-mm stone above it, and he had had previous  hydronephrosis and ureterectasis on CT scan.  After understanding risks,  benefits and alternatives he has elected to proceed with the above  procedure.   PROCEDURE IN DETAIL:  The patient was placed in supine position, after  proper LMA anesthesia was placed in the dorsal lithotomy position and  prepped and draped with Betadine in a sterile fashion.  Cystourethroscopy was performed with a 22.5-French Olympus panendoscope.  The bladder was carefully inspected and noted to be without lesions.  The left ureteral orifice was easily visualized under fluoroscopic  guidance.  A 0.03 French sensor wire was placed into the left renal  pelvis.  On  fluoroscopy nephrolithiasis could be noted but it was  interesting that the distal stones could not be well-seen on  fluoroscopy.  The distal ureter was dilated with the inner dilating  sheath of a ureteral access sheath and ureteroscopy performed with a  long, thin ureteroscope.  The entire ureter up to the ureteropelvic  junction was visualized.  It was noted to be dilated but no stones were  present, obviously signifying  recent passage of the stone.  Again, it was carefully viewed from all  angles and there were absolute no stones within it.  The ureteroscope  was visually removed, the bladder was drained, and the patient was taken  to the recovery room stable.  No stent was left in place.      Ronald L. Earlene Plater, M.D.  Electronically Signed     RLD/MEDQ  D:  07/15/2007  T:  07/15/2007  Job:  [id number]

## 2010-09-11 NOTE — Op Note (Signed)
NAME:  Manuel Garner, Manuel Garner NO.:  [id number]   MEDICAL RECORD NO.:  [id number]          PATIENT TYPE:  AMB   LOCATION:  ENDO                         FACILITY:  [facility]   PHYSICIAN:  Georgiana Spinner, M.D.    DATE OF BIRTH:  1927-07-03   DATE OF PROCEDURE:  DATE OF DISCHARGE:                               OPERATIVE REPORT   PROCEDURE:  Upper endoscopy.   INDICATIONS:  Gastroesophageal reflux disease with known resected  adenocarcinoma of the distal esophagus and recurrent vomiting.   ANESTHESIA:  Fentanyl 50 mcg, Versed 4 mg.   DESCRIPTION OF PROCEDURE:  With the patient mildly sedated in the left  lateral decubitus position, the Pentax videoscopic endoscope was  inserted in the mouth, passed under direct vision through the esophagus  which appeared normal until we reached the distal esophagus and there  was changes of esophagitis with ulceration and probably [facility]  grade changes of esophagitis and further distally we noted numerous  metal sutures protruding into the lumen from through the mucosa.  There  was an area of erythema at the squamocolumnar junction that was  biopsied.  We then entered into the stomach and suctioned out a copious  amount of saliva and liquid but no solid food was noted. Once this was  accomplished, we were able to advance through the stomach and pylorus  into the duodenal bulb and second portion duodenum.  From this point,  the endoscope was slowly withdrawn taking circumferential views of the  duodenal mucosa until the endoscope had been pulled back in the stomach,  placed in retroflexion to view the stomach from below and a slight  opening of the lower esophageal sphincter area was noted.  The endoscope  was straightened and withdrawn taking circumferential views of the  remaining gastric and esophageal mucosa.  The patient's vital signs and  pulse oximeter remained stable.  The patient tolerated the procedure  well without apparent  complications.   FINDINGS:  Prominent metal suture seen in the esophagus with erythema of  the distal esophagus and marked esophagitis noted, probably poor  emptying noted clinically with copious amounts of liquid secretions in  the fundus.   PLAN:  Will maximize PPI therapy and continue metoclopramide therapy and  have the patient follow-up with me for the results of the biopsy and as  an outpatient.           ______________________________  Georgiana Spinner, M.D.     GMO/MEDQ  D:  12/09/2006  T:  12/10/2006  Job:  [id number]   cc:   Gerlene Burdock A. Alanda Amass, M.D.  Fax: [phone]   Ines Bloomer, M.D.  [address]  [place]  [place] [address]

## 2010-09-11 NOTE — Op Note (Signed)
[name], Manuel Garner NO.:  [id number]   MEDICAL RECORD NO.:  [id number]          PATIENT TYPE:  INP   LOCATION:  2037                         FACILITY:  [facility]   PHYSICIAN:  Georgiana Spinner, M.D.    DATE OF BIRTH:  [birth year]   DATE OF PROCEDURE:  DATE OF DISCHARGE:                               OPERATIVE REPORT   PROCEDURE:  Upper endoscopy.   INDICATIONS:  Gastrointestinal bleeding.   ANESTHESIA:  Fentanyl 60 mcg, Versed 6 mg.   PROCEDURE IN DETAIL:  With the patient mildly sedated in the left  lateral decubitus position the Pentax videoscopic endoscope was inserted  in the mouth and passed under direct vision through the esophagus which  appeared normal until we reached the distal esophagus and he had  moderate degree of esophagitis seen with superficial ulceration and  erythema.  This was photographed only.  The previously noted sutures  from his esophagectomy were once again seen.  This area appeared to be  healing fairly nicely at this time.  This was photographed only as well.  We entered into the stomach, fundus, body and antrum were normal.  The  pylorus was spastic but eventually was able to get through the pylorus  into the duodenal bulb, second portion duodenum and those two again also  appeared normal.  From this point the endoscope was slowly withdrawn  taking circumferential views of duodenal mucosa until the endoscope had  been pulled back into the stomach, placed in retroflexion to view the  stomach from below.  The endoscope was then straightened and withdrawn  taking circumferential views of remaining gastric and esophageal mucosa.  The patient's vital signs, pulse oximeter remained stable.  The patient  tolerated the procedure well without apparent complications.   FINDINGS:  Moderate esophagitis photographed only at this point.   ASSESSMENT:  Though this certainly could explain the patient's GI blood  loss of a chronic nature, given the  significance of his iron deficiency  seen on lab studies I do think that colonoscopy would be warranted.  His  last one was about 5-6 years ago.  So I have discussed with family and  we will proceed to do that on Monday after prep.  Agree with transfusion  at this point.           ______________________________  Georgiana Spinner, M.D.     GMO/MEDQ  D:  06/12/2007  T:  06/14/2007  Job:  [id number]   cc:   Gerlene Burdock A. Alanda Amass, M.D.  Ines Bloomer, M.D.

## 2010-09-11 NOTE — Letter (Signed)
December 30, 2006   Georgiana Spinner, M.D.  [address]  [place], [place] [address]   Re:  RAYMON, SCHLARB                 DOB:  July 20, 1927   Dear Greggory Stallion:   I saw this patient back for followup today.  His blood pressure is  125/68, pulse 75, respirations 18, sats were 98%, and his weight was  153, which is stable.  He still is having some intermittent vomiting,  but this is improved.  Apparently you did an endoscopy and it showed  some erythema distally.  Will continue monitoring him.  A prescription  for his Protonix 40 mg twice a day, as well as his Reglan 10 mg 3 times  a day.  I am to see him back again in 2 months.  The patient appears to  be slightly better than what he was when we saw him last time.   Sincerely,   Ines Bloomer, M.D.  Electronically Signed   DPB/MEDQ  D:  12/30/2006  T:  12/31/2006  Job:  [id number]

## 2010-09-11 NOTE — Letter (Signed)
June 28, 2009   Massie Maroon, MD  [address], [address]  [place], [place]  [address]   Re:  Manuel Garner, Manuel Garner                 DOB:  07/11/1927   Dear Rosanne Ashing,   I appreciate the opportunity of seeing the patient.  You took over his  care since Dr. Sabino Gasser left.  I saw him today with a followup CT scan  that showed no evidence of recurrence of his cancer.  He is doing much  better since being placed on Carafate.  Plan to see him back.  He is now  3-1/2 years since his resection.  I will see him back again in 6 months  with another CT scan.   Ines Bloomer, M.D.  Electronically Signed   DPB/MEDQ  D:  06/28/2009  T:  06/29/2009  Job:  [id number]

## 2010-09-14 NOTE — Op Note (Signed)
NAME:  ASON, HESLIN NO.:  [id number]   MEDICAL RECORD NO.:  [id number]          PATIENT TYPE:  INP   LOCATION:  5001                         FACILITY:  [facility]   PHYSICIAN:  Sharolyn Douglas, M.D.        DATE OF BIRTH:  09-08-1927   DATE OF PROCEDURE:  08/22/2004  DATE OF DISCHARGE:                                 OPERATIVE REPORT   DIAGNOSIS:  Lumbar spinal stenosis   PROCEDURE:  Lumbar laminectomy L3-4 to L4-5.   SURGEON:  Sharolyn Douglas, M.D.   ASSISTANT:  Verlin Fester, P.A.   ANESTHESIA:  General endotracheal.   COMPLICATIONS:  None.   INDICATIONS:  The patient is a pleasant 77 year old male with severe lumbar  spinal stenosis at L3-4 and L4-5. He has elected to undergo lumbar  laminectomy in hopes of improving his symptoms. Risk and benefits reviewed.   PROCEDURE:  The patient was identified in the holding area and taken to the  operating room where he under general endotracheal anesthesia without  difficulty, given prophylactic IV antibiotics. Carefully positioned prone on  the Wilson frame. All bony prominences padded. Face and eyes protected at  all times. Back prepped, draped usual sterile fashion. Midline incision made  over the L3-4, L4-5 levels in the midline. Dissection was carried sharply  through deep fascia. Paraspinal muscles were elevated out to the facette  joints. Care was taken to preserve the joint capsules. The pars  interarticularis was identified. Intraoperative x-ray confirmed location. We  then completed a laminectomy by removing the entire spinous process and  lamina of L3 and L4. We found severe underlying spinal stenosis. We carried  the decompression out into the lateral recess undercutting the facette and  removing the hypertrophied ligamentum. We palpated the nerve roots out the  foramen. We felt we had a good decompression. The wound was irrigated. The  deep fascia closed with a running #1 Vicryl suture. Subcutaneous layer  closed  with 2-0 Vicryl followed by a running 3-0 subcuticular Vicryl suture  on the skin edges. Benzoin, Steri-Strips placed. The patient was transferred  to recovery in stable condition.      MC/MEDQ  D:  08/22/2004  T:  08/23/2004  Job:  [id number]

## 2010-09-14 NOTE — Op Note (Signed)
[place]  Patient:    Manuel Garner, Manuel Garner                          MRN: [id number] Proc. Date: 09/03/99 Adm. Date:  [id number] Disc. Date: [id number] Attending:  Armanda Magic Dictator:   Armanda Magic, M.D. CC:         Richard A. Alanda Amass, M.D.                           Operative Report  PROCEDURE:  Tilt table test.  HISTORY OF PRESENT ILLNESS:  This is a 77 year old male with a history of dizziness in the past, but no frank syncope or presyncope who presents for workup of dizziness with a tilt table test.  DESCRIPTION OF PROCEDURE:  The patient was brought to the electrophysiology laboratory in a fasting state.  Informed consent was obtained.  The patient was connected to continuous heart rate and pulse oximetry monitoring and intermittent blood pressure monitoring.  Baseline blood pressure was measured for a total of 8 minutes.  At baseline his blood pressure was 135/73 to 144/73, with baseline heart rate in the 50s.  He was then tilted upright to 70 degrees for a total of 17 minutes.  At 14 minutes into the tilt his blood pressure dropped to 99/62, with a heart rate of 58.  Then at 17 minutes into the tilt his blood pressure dropped to 54/43 with a pulse of 38.  He complained of weakness and dizziness and became very pale and diaphoretic.  He was immediately placed supine with resolution of his hypotension and bradycardia.  His symptoms resolved as well.  He was then transferred back to his room in stable condition.  IMPRESSION:  Positive tilt table test for vasovagal syncope.  PLAN:  He is already on a beta blocker, atenolol 25 mg q.d.  We will add Zoloft 25 mg q.d.  He will follow up with Dr. Alanda Amass in two weeks. DD:  09/03/99 TD:  09/05/99 Job: [id number]

## 2010-09-14 NOTE — Op Note (Signed)
NAME:  Manuel Garner, Manuel Garner NO.:  [id number]   MEDICAL RECORD NO.:  [id number]          PATIENT TYPE:  AMB   LOCATION:  ENDO                         FACILITY:  [facility]   PHYSICIAN:  Georgiana Spinner, M.D.    DATE OF BIRTH:  01-24-1928   DATE OF PROCEDURE:  07/09/2006  DATE OF DISCHARGE:  07/09/2006                               OPERATIVE REPORT   PROCEDURE:  Upper endoscopy with biopsy.   INDICATIONS:  Barrett's esophagus.   ANESTHESIA:  Fentanyl 37 mcg, Versed 4 mg.   DESCRIPTION OF PROCEDURE:  With the patient mildly sedated in the left  lateral decubitus position, the Pentax videoscopic endoscope was  inserted in the mouth and passed under direct vision through the  esophagus which appeared normal until we reached the distal esophagus  where there were changes of Barrett's esophagus, photographed, and  multiple biopsies taken.  Entered into the stomach.  The fundus, body,  antrum, duodenal bulb, and second portion of the duodenum all appeared  normal.  From this point, the endoscope was slowly withdrawn taking  circumferential views of the duodenal mucosa until the endoscope had  been pulled back into the stomach and placed in retroflexion to view the  stomach from below. The endoscope was straightened and withdrawn taking  circumferential views of the remaining gastric and esophageal mucosa.  The patient's vital signs and pulse oximeter remained stable.  The  patient tolerated the procedure well without apparent complications.   FINDINGS:  Barrett's esophagus, biopsied.  Await biopsy report.  The  patient will call me for results and follow-up with me as an outpatient.           ______________________________  Georgiana Spinner, M.D.     GMO/MEDQ  D:  07/10/2006  T:  07/11/2006  Job:  [id number]

## 2010-09-14 NOTE — Op Note (Signed)
   NAME:  MALON, BRANTON NO.:  [id number]   MEDICAL RECORD NO.:  [id number]                   PATIENT TYPE:  AMB   LOCATION:  ENDO                                 FACILITY:  [facility]   PHYSICIAN:  Georgiana Spinner, M.D.                 DATE OF BIRTH:  1927-08-25   DATE OF PROCEDURE:  DATE OF DISCHARGE:                                 OPERATIVE REPORT   PROCEDURE:  Colonoscopy.   INDICATIONS:  Rectal bleeding.   ANESTHESIA:  No further given.   DESCRIPTION OF PROCEDURE:  With the patient mildly sedated and in the left  lateral decubitus position, rectal exam was performed which was  unremarkable.  Subsequently the Olympus videoscopic colonoscope was inserted  in the rectum and passed under direct vision to the cecum, identified by  ileocecal valve and the base of the cecum.  In the base of the cecum, there  was a nodularity that was photographed and was biopsied using hot biopsy  forceps technique _________ tissue was retrieved.  This area was eradicated.  From this point,  the colonoscope was slowly withdrawn, taking  circumferential views of the entire colonic mucosa, stopping only in the  rectum which appeared normal on direct and showed  hemorrhoids on  retroflexed view.  The endoscope was straightened and withdrawn.  The  patient's vital signs and pulse oximetry remained stable.  The patient  tolerated the procedure well without apparent complications.   FINDINGS:  1. Nodularity of the cecum, biopsied.  2. Internal hemorrhoids.  3. Otherwise unremarkable examination.   PLAN:  Await biopsy report.  The patient will call me for results and follow  up with me as an outpatient.                                               Georgiana Spinner, M.D.    GMO/MEDQ  D:  12/03/2002  T:  12/03/2002  Job:  [id number]

## 2010-09-14 NOTE — Discharge Summary (Signed)
NAMETILFORD, DEATON NO.:  [id number]   MEDICAL RECORD NO.:  [id number]          PATIENT TYPE:  INP   LOCATION:  2023                         FACILITY:  [facility]   PHYSICIAN:  Ines Bloomer, M.D. DATE OF BIRTH:  [birth year]   DATE OF ADMISSION:  08/24/2006  DATE OF DISCHARGE:  09/22/2006                               DISCHARGE SUMMARY   PRIMARY ADMITTING DIAGNOSIS:  Esophageal cancer.   ADDITIONAL/DISCHARGE DIAGNOSES:  1. 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  2. Status post esophagogastrectomy with esophageal anastomotic leak.  3. Coronary artery disease status post prior CABG.  4. Hyperlipidemia.  5. History of prostate cancer status post seed implant.  6. Obstructive sleep apnea on C-PAP.  7. History of Barrett's esophagus  8. Hypertension.  9. History of back surgery.   PROCEDURES PERFORMED.:  1. Esophagogastrectomy with placement of jejunostomy tube.  2. Left thoracotomy with closure of anastomotic leak using intercostal      and left _serratus anterior muscle flap.   HISTORY:  The patient is a 77 year old male with a history of Barrett's  esophagus first diagnosed in 2004.  He has been followed since that time  with surveillance endoscopies and after having a low grade dysplasia  approximately 1 year ago he underwent repeat endoscopy in March2008  which showed a well differentiated adenocarcinoma.  He underwent  ultrasound esophagoscopy by Dr. Christella Hartigan which showed this to be confined  to the distal 2 cm of the esophagus at the GE junction.  A PET scan was  also obtained and this again showed a low grade uptake at the distal GE  junction with no evidence of any further metastases.  He was referred to  Dr. Dewayne Shorter for surgical consideration and Dr. Edwyna Shell recommended  proceeding with an esophagogastrectomy at this time.  He explained the  risks, benefits and alternatives of the procedure to the patient and he  agreed to proceed.   HOSPITAL COURSE:  He was admitted to [facility] 08/25/2006 and  underwent esophagogastrectomy with placement of a jejunostomy tube.  He  tolerated the procedure well and was transferred to the SICU in stable  condition.  He was able to be extubated on postop day #1 and at that  time was started on tube feeds via his jejunostomy.  He initially did  well postoperatively and underwent a swallowing study which showed no  evidence of extravasation of contrast.  He was started on liquids and  his NG tube was removed.  However approximately 1 week post surgery he  developed acute onset of left chest pain after having a bowel movement.  The chest tube that was in place started draining some old brownish  looking material and a possible anastomotic leak was suspected.  He  underwent x-ray and swallowing study which did confirm a leak.  Because  of this he was returned to the operating room on 09/01/2006 for an  exploratory thoracotomy and closure of anastomotic leak.  He tolerated  the procedure well and was returned to the SICU in stable condition.  He  was slowly weaned from the ventilator and was extubated late in the day  on postop day #1.  He did initially have an acute blood loss anemia  requiring transfusion.  He remained stable and was slowly mobilized.  He  was restarted on his tube feeds.  His chest tube drainage remained  minimal, however on swallowing study performed on 05/13 he did show  extravasation of contrast which was unclear if this was a definite leak  or not.  Because of this his NG tube was discontinued.  He was left  n.p.o. 200 to allow this time to further heal.  He remained in the  unit  for further observation and continued to progress well otherwise.  He  did undergo a repeat swallowing study on 05/19 which showed no evidence  of leak.  At that point he was doing well and was able to be transferred  to the step-down unit.  His chest tubes were removed and he was  started  on liquids which he tolerated without problem.  He was slowly started on  oral intake including full liquid diet and p.o. medications.  Since that  time his diet has been advanced accordingly and presently he is  tolerating a mechanical soft postgastrectomy diet which includes six  small meals a day.  His tube feeds have been decreased and he is  tolerating his diet without problem.  He has remained afebrile and all  vital signs have been stable.  He has been transferred to the floor and  is ambulating without difficulty.  All incisions are healing well.  A  chest x-ray on 09/21/2006 showed mild left lower lobe effusion,  otherwise was clear.  His final pathology did confirm an adenocarcinoma.  His labs on 09/20/2006 showed hemoglobin of 9.6, hematocrit 28.1,  platelets 310, white count 9.2.  Sodium 138, potassium 3.5, BUN 17,  creatinine 0.83.  Since he has been on tube feeds he has been followed  with CBGs q.a.c. and h.s. and has been on a low dose of Lantus insulin  and sliding scale insulin p.r.n..  His blood sugars have remained  stable.  His diet has improved to the point where he has now been  switched to p.o. diet only and tube feeds have been discontinued.  His  Lantus will also be discontinued after today's doses.  He will have a  CBC and a CMET on 09/22/2006 and will be reevaluated on morning rounds.  If he has continued to do well over that period and his labs have  remained stable, he will hopefully be ready for discharge home at that  time.   DISCHARGE MEDICATIONS:  1. Tylox one to two q.6 h p.r.n. for pain  2. Mirapex 0.125 mg t.i.d.  3. Niferex 150 mg daily  4. Protonix 40 mg daily  5. Reglan 10 mg q.8 h  6. Lopressor 12.5 mg b.i.d.   DISCHARGE INSTRUCTIONS:  He is asked to refrain from driving, heavy  lifting or strenuous activity.  He may continue ambulating daily and  using his incentive spirometer.  He may shower daily and clean his incisions with soap  and water.  He has been instructed on  postgastrectomy diet which as previously mentioned includes  six small  meals a day mechanical soft diet with low sugar.   DISCHARGE FOLLOWUP:  He will see Dr. Edwyna Shell back to the office in 1 week  with a chest x-ray from [facility] imaging.  If he experiences  problems  or has questions in the interim he is asked to contact our office  immediately.      Coral Ceo, P.A.      Ines Bloomer, M.D.  Electronically Signed    GC/MEDQ  D:  09/21/2006  T:  09/21/2006  Job:  [id number]   cc:   Gerlene Burdock A. Alanda Amass, M.D.  Redge Gainer. Perini, M.D.

## 2010-09-14 NOTE — H&P (Signed)
NAME:  Manuel Garner, Manuel Garner NO.:  [id number]   MEDICAL RECORD NO.:  [id number]          PATIENT TYPE:  OUT   LOCATION:  XRAY                         [facility]ACILITY:  [facility]   PHYSICIAN:  Ines Bloomer, M.D. DATE OF BIRTH:  1928-02-04   DATE OF ADMISSION:  08/25/2006  DATE OF DISCHARGE:                              HISTORY & PHYSICAL   CHIEF COMPLAINT:  Esophageal cancer.   HISTORY OF PRESENT ILLNESS:  This 77 year old patient was found to have  Barrett esophagus in 2004 and underwent surveillance endoscopy.  He was  found to have a low-grade dysplasia last year, and had an endoscopy on  July 09, 2006 that showed distal esophagus with Barrett's with a well-  differentiated adenocarcinoma.  He underwent ultrasound esophagoscopy by  Dr. Wendall Papa that showed this to be confined to the distal 2 cm of the  esophagus at the GE junction.  A PET scan was obtained, and again showed  a low-grade uptake at the distal GE junction with no evidence of any  spread.  He has had dysphagia in the past, some nausea, vomiting.  History of hiatal hernia.   PAST MEDICAL HISTORY:  Significant for:  1. Coronary artery bypass surgery.  2. He has hyperlipidemia.  3. He has obstructive sleep apnea; he is on a CPAP machine.  4. He has had prostate cancer with seed implant.   MEDICATIONS:  1. Nexium 40 mg twice a day.  2. __________.  3. Hydrochlorothiazide 2.5 mg twice a day.  4. __________ 40 mg a day.  5. Mirapex 1.25 three times a day.  6. Metoprolol 25 mg a day.  7. Nasacort 25 mcg 2 sprays twice a day.  8. Aspirin 81 mg.   He had a heart attack in 1994, and bypass surgery in 1996, and that was  done at _________[facility]Hospital by Dr. Arvilla Market Pucker.  He had a laminectomy in  2004, and prostate cancer in 1997 treated with seed implant.   FAMILY HISTORY:  Positive for vascular disease and cancer.   SOCIAL HISTORY:  He is married and has two children.  He is retired, and  used to be a Academic librarian at [employer].  He quit smoking in the  1950s; does not drink alcohol on a regular basis.   REVIEW OF SYSTEMS:  His weight is stable; he is 190 pounds.  Cardiac  shows no angina or atrial fibrillation.  Pulmonary:  He gets shortness  of breath with exertion.  GI:  See history of present illness.  He has  had reflux, intermittent diarrhea, and was told he had a hiatal hernia.  GU:  He has no frequent urination.  Vascular:  No claudication, DVT or  TIA.  His DVT has been treated for a TIA, and he has also been treated  for restless leg syndrome.  Neurological:  He has some dizzy spells.  No  headaches, blackouts or seizures.  Musculoskeletal:  He has arthritis.  Psychiatric:  No problems with depression or nervousness.  HEENT:  No  change in his eye site or  hearing.  Hematological:  No problems with  bleeding or clotting disorder.   PHYSICAL EXAMINATION:  GENERAL:  He is a well-developed, Caucasian male  in no acute distress.  VITAL SIGNS:  His blood pressure is 142/74, pulse 67, respirations 18,  saturation 95%.  HEENT:  Head is atraumatic.  Eyes:  Pupils equal, react to light and  accommodation.  Extraocular movements are normal.  Ears:  Tympanic  membranes are intact.  Nose:  No septal deviation.  Throat is without  lesions.  The uvula is midline.  Neck is supple without thyromegaly.  There is no supraclavicular or axillary adenopathy.  No carotid bruits.  CHEST:  Clear to auscultation and percussion.  HEART:  Regular sinus rhythm.  No murmurs.  ABDOMEN:  Soft.  There is no hepatosplenomegaly.  PULSES:  Are 2+, there is no clubbing or edema.  NEUROLOGICAL:  He is oriented x3.  Sensory and motor intact.  Cranial  nerves are intact.  SKIN:  Without lesion.   IMPRESSION:  1. Probable stage I esophageal cancer at the gastroesophageal      junction.  2. Barrett's esophagus.  3. Coronary artery disease, history of coronary artery bypass and      myocardial infarction.  4.  Hypertension.  5. History of prostate cancer.  6. Hyperlipidemia.  7. Obstructive sleep apnea.   PLAN:  Esophagogastrectomy.      Ines Bloomer, M.D.  Electronically Signed     DPB/MEDQ  D:  08/21/2006  T:  08/21/2006  Job:  [id number]

## 2010-09-14 NOTE — Op Note (Signed)
NAME:  Manuel Garner, Manuel Garner NO.:  [id number]   MEDICAL RECORD NO.:  [id number]          PATIENT TYPE:  AMB   LOCATION:  ENDO                         FACILITY:  [facility]   PHYSICIAN:  Georgiana Spinner, M.D.    DATE OF BIRTH:  Feb 21, 1928   DATE OF PROCEDURE:  01/22/2006  DATE OF DISCHARGE:                                 OPERATIVE REPORT   PROCEDURE:  Upper endoscopy.   INDICATIONS:  Gastroesophageal reflux with known Barrett's esophagus.   ANESTHESIA:  Demerol 35 mg, Versed 3 mg.   PROCEDURE:  With the patient mildly sedated in the left lateral decubitus  position, the Olympus videoscopic endoscope was inserted in the mouth and  passed under direct vision through the esophagus which appeared normal until  we reached the distal esophagus and changes of Barrett's were seen,  photographed, and biopsied.  We entered into the stomach, the fundus, body,  antrum, duodenal bulb, and second portion of the duodenum were visualized.  From this point, the endoscope was slowly withdrawn taking circumferential  views of the duodenal mucosa until the endoscope had been pulled back into  the stomach and placed in retroflexion to view the stomach from below. The  endoscope was then straightened and withdrawn, taking circumferential views  of the remaining gastric and esophageal mucosa.  The patient's vital signs  and pulse oximeter remained stable.  The patient tolerated the procedure  well without apparent complications.   FINDINGS:  Barrett's esophagus biopsied.  Await biopsy report.  The patient  will call me for results and follow-up with me as an outpatient.           ______________________________  Georgiana Spinner, M.D.     GMO/MEDQ  D:  01/22/2006  T:  01/23/2006  Job:  [id number]

## 2010-09-14 NOTE — H&P (Signed)
NAME:  DAY, DEERY NO.:  [id number]   MEDICAL RECORD NO.:  [id number]          PATIENT TYPE:  INP   LOCATION:  NA                           FACILITY:  [facility]   PHYSICIAN:  Sharolyn Douglas, M.D.        DATE OF BIRTH:  08-05-1927   DATE OF ADMISSION:  08/22/2004  DATE OF DISCHARGE:                                HISTORY & PHYSICAL   This history and physical was performed in our office on August 16, 2004.   CHIEF COMPLAINT:  Pain in my back and leg on the right.   HISTORY OF PRESENT ILLNESS:  This 77 year old white male seen by [place] for  continuing progressive problems concerning pain in the lumbar region as well  as radiation in the right lower extremity. He has numbness and tingling into  the right lower extremity with radiation over the dorsum of the foot. He  cannot feel his heel when he ambulates and occasionally falls secondary to  the fact that he just cannot feel the ground when he puts his foot down. He  had has epidural steroid injections in the past which helped for a short  period of time, but unfortunately, he is left with his residual pain and  discomfort which is interfering with his day to day activity. He is a very  young acting gentleman, and he and his wife have an active life, but  unfortunately, this is interfering with his day to day activity. Examination  has shown difficulty with ambulation. He flexes forward. He had diminished  reflexes in the right knee compared to the left. His left reflex is totally  out. He has slightly positive SLR to the right with some buttock pain and  discomfort. Augmentation sign is negative. A MRI has shows stenosis at L2-L3  and L3-L4. He has severe central and lateral recess narrowing. After much  discussion and consideration concerning going along with surgery at this  time and after the risks and benefits were described to the patient, it was  decided to go ahead with a L3-L4 and L4-L5 lumbar laminectomy. This  patient  has been cleared preoperatively by Dr. Susa Griffins and Dr. Juline Patch.   PAST MEDICAL HISTORY:  The patient has had sleep apnea and does well on CPAP  machine. Cardiolite study done recently interpreted by Dr. Alanda Amass has  cleared him for surgery. I had a MI back in 1994. He does have a hiatal  hernia with some reflux. He had seed implants secondary to prostate disease  and cancer in 1997. He has a CABG in 1997 with three arteries.   FAMILY HISTORY:  Positive for heart disease in the father and mother, both  have passed.   SOCIAL HISTORY:  The patient is married. He is retired. He has no intake of  alcohol or tobacco products. He has two children, but his wife will be care  giver after surgery.   REVIEW OF SYSTEMS:  CENTRAL NERVOUS SYSTEM:  No seizure disorder, paralysis,  double vision. The patient does have radiculitis into the right lower  extremity as mentioned above. CARDIOVASCULAR:  No chest pain, no angina, no  orthopnea. RESPIRATORY:  No productive cough. No hemoptysis. No shortness of  breath. GASTROINTESTINAL:  No nausea, vomiting, melena, or bloody stools.  GENITOURINARY:  No discharge, dysuria, or hematuria.   PHYSICAL EXAMINATION:  GENERAL:  He is a alert, cooperative, friendly, 30-  year-old white male who is accompanied by his wife.  VITAL SIGNS:  Blood pressure 138/60, pulse 72, respirations 12.  HEENT:  Normocephalic. PERRLA. EOM intact. Oropharynx is clear.  CHEST:  Clear to auscultation. No rhonchi, no rales.  HEART:  With regular rate and rhythm. There is a grade 3/6 holosystolic  murmur. It is loud and blowing, heard best at the apex. He also has aortic  holosystolic murmur as well.  ABDOMEN:  Soft and nontender. Liver and spleen not felt.  GENITOURINARY/RECTAL:  Not done, not pertinent to present illness.  EXTREMITIES:  As in present illness above.   ADMISSION DIAGNOSES:  1.  Severe spinal stenosis, L3-L4/L4-L5.  2.  Remote history of  myocardial infarction.  3.  Gastroesophageal reflux disease.  4.  Dyslipidemia.   PLAN:  The patient will be admitted for L3-L4/L4-L5 lumbar laminectomy. If  we should have any medical problems while this patient is in the hospital,  we will certainly call on his physicians to follow along with [place] during this  patient's hospitalization.      DLU/MEDQ  D:  08/16/2004  T:  08/16/2004  Job:  [id number]   cc:   Juline Patch, M.D.  [address]  [place], [place] [address]  Fax: [phone]   Richard A. Alanda Amass, M.D.  [phone]  [place]  [place] [address]  Fax: [phone]

## 2010-09-14 NOTE — Op Note (Signed)
   NAME:  JAKARIE, PEMBER NO.:  [id number]   MEDICAL RECORD NO.:  [id number]                   PATIENT TYPE:  AMB   LOCATION:  ENDO                                 [facility]ACILITY[facility]   PHYSICIAN:  Georgiana Spinner, M.D.                 DATE OF BIRTH:  1927-08-17   DATE OF PROCEDURE:  DATE OF DISCHARGE:                                 OPERATIVE REPORT   PROCEDURE:  Upper endoscopy.   INDICATIONS:  Abdominal pain and rectal bleeding.   ANESTHESIA:  Demerol 50 mg, Versed 5 mg.   DESCRIPTION OF PROCEDURE:  With the patient mildly sedated in the left  lateral decubitus position, the Olympus videoscopic endoscope was inserted  in the mouth and passed under direct vision through the esophagus into the  stomach.  The  fundus, body, antrum, duodenal bulb and second portion of the  duodenum were visualized.  From this point, the endoscope was slowly  withdrawn taking circumferential views of the duodenal mucosa until the  endoscope then pulled back into the stomach, placed in retroflexion and  viewed the stomach from below.  A hiatal hernia was seen and photographed.  The endoscope was straightened, withdrawn, and taking circumferential views  of the remaining gastric and esophageal mucosa, stopping at the distal  esophagus where the Z-line appeared possibly slightly irregular.  This was  photographed and biopsied.  The patient's vital signs and pulse oximetry  remained stable.  The patient tolerated the procedure well without apparent  complication.   FINDINGS:  Irregularity of the Z-line, biopsied.  Await biopsy report.  The  patient will call me for results and follow up with me as an outpatient.  Proceed to colonoscopy as planned.                                                Georgiana Spinner, M.D.    GMO/MEDQ  D:  12/03/2002  T:  12/03/2002  Job:  [id number]

## 2010-09-14 NOTE — Procedures (Signed)
[facility]  Patient:    Manuel Garner, Manuel Garner                          MRN: [id number] Proc. Date: 09/04/99 Adm. Date:  [id number] Disc. Date: [id number] Attending:  Armanda Magic Dictator:   Armanda Magic, M.D. CC:         Richard A. Alanda Amass, M.D.                           Procedure Report  HISTORY OF PRESENT ILLNESS:  Mr. [name] is a 77 year old white male patient of Dr. Mancel Parsons with a history of dizzy episodes.  He now presents for tilt table testing.  DESCRIPTION OF PROCEDURE:  The patient was brought to the electrophysiology laboratory in a fasting non-sedated state.  Informed consent was obtained. The patient was connected to continuous heart rate and pulse oximetry monitoring, and intermittent blood pressure monitoring.  Baseline blood pressure was measured for 8 minutes.  His baseline blood pressure ranged from 131/70 to 144/73, with a resting heart rate in the low 50s.  He was then tilted upright at 70 degrees for a total of 16 minutes.  At 14 minutes into the tilt his blood pressure dropped to 99/62, with a pulse of 58.  He then became weak and dizzy feeling, and became diaphoretic and pale in color.  His blood pressure dropped to 54/43, and his heart rate dropped to 38.  He was immediately placed supine with resolution of his hypotension and bradycardia. He was then transferred back to his room in stable condition.  IMPRESSION:  Impression tilt table test for vasovagal syncope.  He is currently on beta blockers.  We will add Zoloft 25 mg q.d.  He will follow up with Dr. Alanda Amass in two weeks.DD:  09/04/99 TD:  09/06/99 Job: [id number]

## 2010-09-14 NOTE — Op Note (Signed)
NAME:  Manuel Garner, Manuel Garner NO.:  [id number]   MEDICAL RECORD NO.:  [id number]          PATIENT TYPE:  OUT   LOCATION:  XRAY                         FACILITY:  [facility]   PHYSICIAN:  Ines Bloomer, M.D. DATE OF BIRTH:  August 06, 1927   DATE OF PROCEDURE:  DATE OF DISCHARGE:  08/20/2006                               OPERATIVE REPORT   PREOPERATIVE DIAGNOSIS:  Status post esophagogastrectomy with esophageal  anastomotic leak.   POSTOPERATIVE DIAGNOSIS:  Status post esophagogastrectomy with  esophageal anastomotic leak.   OPERATION PERFORMED:  Left thoracotomy, closure of anastomotic leak with  intercostal and left serratus anterior muscle flap.   SURGEON:  Ines Bloomer, M.D.   FIRST ASSISTANT:  Jacqulyn Ducking, PA-C   ANESTHESIA:  General.   This patient was approximately one week post esophagogastrectomy, was  doing well, tolerated liquids, had a normal swallow 48 hours previously,  when he went to the bathroom and immediately coming out of the bathroom  suddenly developed severe left chest pain.  The chest tube that was in  place started draining some old, brownish-looking material.  A possible  leak was suspected.  He was taken to x-ray and had a swallow that showed  a leak.  He was transferred to the ICU and prepared for surgery.  The  family was informed and explained the situation.   He was brought to the operating room.  A dual-lumen tube was inserted.  He was turned to the left lateral thoracotomy position, was prepped and  draped in the usual sterile manner.  The patient had a previous anterior  fifth intercostal space thoracotomy and we extended that posteriorly,  the body of latissimus, reflecting the serratus superiorly at the fifth  intercostal space.  The lung was taken down.  The chest tube had been  removed, and the lung was reflected superiorly up to the inferior  pulmonary vein.  The NG tube was inserted.  We examined the anastomosis  and found  that the NG tube was through the anastomosis but on the  anterolateral portion of the anastomosis there was about a 1-cm to 1.5-  cm disruption.  The staples had pulled away from the inferior portion  from the stomach side.  We examined the anastomosis through the leak and  the rest of the anastomosis appeared to be intact, examined both from  the inside and externally.  There was minimal nonviable tissue.  Two  smaller layers were debrided and then the anastomosis was re-sutured  with interrupted 2-0 Prolene, going from the esophagus down to the  stomach, closing the leak.   After that had been done, we put water over the anastomosis and then  blew air into the stomach, and had no leak of air from the anastomosis.  That being the case, we then put 10 mL of  Tisseal around the  anastomosis and took out a portion of the sixth rib posteriorly and then  took down a sixth intercostal muscle flap, and sutured that around the  anastomosis.  To doubly reinforce that, we then took down a distal  serratus flap, dissecting that with electrocautery and bringing it down  through the hole in the ribs where the sixth rib had been, and suturing  that around the anastomosis with 2-0 Vicryl.  A chest tube from the  posterior chest tube was brought in, and another chest tube was brought  in posterior to that.  The anterior one was a right-angled chest tube;  it was placed down near the anastomosis.  The other one was a little  more lateral; it was brought in laterally.   The chest was then closed with interrupted #1 Vicryl pericostals, #1  Vicryl in the muscle layer, 2-0 Vicryl in the subcutaneous tissue, and  the skin with skin clips.  Patient was then returned to the Intensive  Care Unit in serious condition.      Ines Bloomer, M.D.  Electronically Signed     DPB/MEDQ  D:  09/01/2006  T:  09/01/2006  Job:  [id number]

## 2010-09-14 NOTE — Op Note (Signed)
NAME:  Manuel Garner, ROOTS NO.:  [id number]   MEDICAL RECORD NO.:  [id number]                   PATIENT TYPE:  AMB   LOCATION:  ENDO                                 FACILITY:  [facility]   PHYSICIAN:  Georgiana Spinner, M.D.                 DATE OF BIRTH:  1927/08/03   DATE OF PROCEDURE:  12/15/2003  DATE OF DISCHARGE:                                 OPERATIVE REPORT   PROCEDURE:  Upper endoscopy.   INDICATIONS FOR PROCEDURE:  Dysphagia.   ANESTHESIA:  Demerol 50, Versed 5 mg.   PROCEDURE:  With the patient mildly sedated in the left lateral decubitus  position, the Olympus videoscopic endoscope was inserted in the mouth and  passed under direct vision through the esophagus which appeared normal until  we reached the distal esophagus and there were changes of Barrett's seen and  photographed and biopsied.  We entered into the stomach.  There was no  hiatal hernia.  The fundus, body, antrum, duodenal bulb, and second portion  of the duodenum appeared normal.  From this point, the endoscope was slowly  withdrawn taking circumferential views of the entire duodenal mucosa until  the endoscope was pulled back into the stomach and placed in retroflexion to  view the stomach from below.  The endoscope was then straightened and the  scope withdrawn.  The patient's vital signs, pulse oximeter remained stable.  The patient tolerated the procedure well without apparent complications.   FINDINGS:  Barrett's esophagus.   PLAN:  Await biopsy report, the patient will call me for results and follow  up with me as an outpatient.                                               Georgiana Spinner, M.D.    GMO/MEDQ  D:  12/15/2003  T:  12/15/2003  Job:  [id number]

## 2011-01-18 LAB — CBC
HCT: 24.7 — ABNORMAL LOW
HCT: 28.7 — ABNORMAL LOW
HCT: 30.2 — ABNORMAL LOW
HCT: 28.8 — ABNORMAL LOW
HCT: 31.6 — ABNORMAL LOW
HCT: 32.1 — ABNORMAL LOW
Hemoglobin: 8 — ABNORMAL LOW
Hemoglobin: 8.1 — ABNORMAL LOW
Hemoglobin: 9.5 — ABNORMAL LOW
Hemoglobin: 10.6 — ABNORMAL LOW
MCHC: 32.2
MCHC: 32.3
MCHC: 32.4
MCHC: 32.4
MCHC: 32.4
MCV: 74.6 — ABNORMAL LOW
MCV: 75.3 — ABNORMAL LOW
Platelets: 231
Platelets: 164
Platelets: 175
Platelets: 191
Platelets: 192
RBC: 3.47 — ABNORMAL LOW
RBC: 3.81 — ABNORMAL LOW
RBC: 4.05 — ABNORMAL LOW
RDW: 16 — ABNORMAL HIGH
RDW: 16.4 — ABNORMAL HIGH
RDW: 16.6 — ABNORMAL HIGH
RDW: 18.5 — ABNORMAL HIGH
RDW: 18.6 — ABNORMAL HIGH
WBC: 10.6 — ABNORMAL HIGH
WBC: 6.9
WBC: 9.8
WBC: 10.2
WBC: 7.6

## 2011-01-18 LAB — DIFFERENTIAL
Lymphocytes Relative: 40
Neutrophils Relative %: 54

## 2011-01-18 LAB — HEMOGLOBIN AND HEMATOCRIT, BLOOD
HCT: 30.6 — ABNORMAL LOW
Hemoglobin: 10.1 — ABNORMAL LOW

## 2011-01-18 LAB — IRON AND TIBC
Iron: 15 — ABNORMAL LOW
UIBC: 417

## 2011-01-18 LAB — CROSSMATCH: Antibody Screen: NEGATIVE

## 2011-01-18 LAB — COMPREHENSIVE METABOLIC PANEL
AST: 21
Albumin: 3.9
BUN: 19
Chloride: 105
Creatinine, Ser: 1
GFR calc Af Amer: >60
Total Protein: 6.8

## 2011-01-18 LAB — B-NATRIURETIC PEPTIDE (CONVERTED LAB): Pro B Natriuretic peptide (BNP): 303 — ABNORMAL HIGH

## 2011-01-18 LAB — VITAMIN B12: Vitamin B-12: 445 (ref 211–911)

## 2011-01-18 LAB — PROTIME-INR: INR: 1

## 2011-01-18 LAB — D-DIMER, QUANTITATIVE: D-Dimer, Quant: 0.45

## 2011-01-18 LAB — FOLATE: Folate: >20

## 2011-01-18 LAB — CK TOTAL AND CKMB (NOT AT ARMC)
CK, MB: 3.8
Relative Index: 3 — ABNORMAL HIGH

## 2011-01-18 LAB — BASIC METABOLIC PANEL
CO2: 27
Glucose, Bld: 121 — ABNORMAL HIGH
Potassium: 3.6
Sodium: 141

## 2011-01-21 LAB — COMPREHENSIVE METABOLIC PANEL
ALT: 14
Alkaline Phosphatase: 56
BUN: 23
CO2: 29
Chloride: 106
Glucose, Bld: 104 — ABNORMAL HIGH
Potassium: 4.5
Sodium: 140
Total Bilirubin: 0.7

## 2011-01-21 LAB — URINALYSIS, ROUTINE W REFLEX MICROSCOPIC
Glucose, UA: NEGATIVE
Nitrite: NEGATIVE
Specific Gravity, Urine: 1.019
pH: 5.5

## 2011-01-21 LAB — CBC
HCT: 36 — ABNORMAL LOW
Hemoglobin: 11.7 — ABNORMAL LOW
RBC: 4.68
WBC: 6.7

## 2011-01-21 LAB — PROTIME-INR
INR: 1
Prothrombin Time: 13.4

## 2012-09-01 ENCOUNTER — Other Ambulatory Visit (HOSPITAL_COMMUNITY): Payer: Self-pay | Admitting: Cardiovascular Disease

## 2012-09-01 DIAGNOSIS — I25810 Atherosclerosis of coronary artery bypass graft(s) without angina pectoris: Secondary | ICD-10-CM

## 2012-09-01 DIAGNOSIS — R0989 Other specified symptoms and signs involving the circulatory and respiratory systems: Secondary | ICD-10-CM

## 2012-09-01 DIAGNOSIS — I421 Obstructive hypertrophic cardiomyopathy: Secondary | ICD-10-CM

## 2012-09-16 ENCOUNTER — Ambulatory Visit (HOSPITAL_COMMUNITY)
Admission: RE | Admit: 2012-09-16 | Discharge: 2012-09-16 | Disposition: A | Discharge status: Home / Self Care | Payer: Medicare Other | Source: Ambulatory Visit | Attending: Cardiovascular Disease | Admitting: Cardiovascular Disease

## 2012-09-16 DIAGNOSIS — I059 Rheumatic mitral valve disease, unspecified: Secondary | ICD-10-CM | POA: Insufficient documentation

## 2012-09-16 DIAGNOSIS — R0989 Other specified symptoms and signs involving the circulatory and respiratory systems: Secondary | ICD-10-CM

## 2012-09-16 DIAGNOSIS — I421 Obstructive hypertrophic cardiomyopathy: Secondary | ICD-10-CM | POA: Insufficient documentation

## 2012-09-16 NOTE — Progress Notes (Signed)
Carotid Duplex Completed. °Brianna L Mazza ° °

## 2012-09-16 NOTE — Progress Notes (Signed)
2D Echo Performed 09/16/2012    Kazimierz Springborn, RCS  

## 2012-09-17 ENCOUNTER — Ambulatory Visit (HOSPITAL_COMMUNITY)
Admission: RE | Admit: 2012-09-17 | Discharge: 2012-09-17 | Disposition: A | Discharge status: Home / Self Care | Payer: Medicare Other | Source: Ambulatory Visit | Attending: Physician Assistant | Admitting: Physician Assistant

## 2012-09-17 DIAGNOSIS — I2510 Atherosclerotic heart disease of native coronary artery without angina pectoris: Secondary | ICD-10-CM | POA: Insufficient documentation

## 2012-09-17 DIAGNOSIS — Z951 Presence of aortocoronary bypass graft: Secondary | ICD-10-CM | POA: Insufficient documentation

## 2012-09-17 DIAGNOSIS — R9431 Abnormal electrocardiogram [ECG] [EKG]: Secondary | ICD-10-CM | POA: Insufficient documentation

## 2012-09-17 DIAGNOSIS — I779 Disorder of arteries and arterioles, unspecified: Secondary | ICD-10-CM | POA: Insufficient documentation

## 2012-09-17 DIAGNOSIS — R0989 Other specified symptoms and signs involving the circulatory and respiratory systems: Secondary | ICD-10-CM | POA: Insufficient documentation

## 2012-09-17 DIAGNOSIS — R42 Dizziness and giddiness: Secondary | ICD-10-CM | POA: Insufficient documentation

## 2012-09-17 DIAGNOSIS — I359 Nonrheumatic aortic valve disorder, unspecified: Secondary | ICD-10-CM

## 2012-09-17 DIAGNOSIS — I252 Old myocardial infarction: Secondary | ICD-10-CM | POA: Insufficient documentation

## 2012-09-17 DIAGNOSIS — R0609 Other forms of dyspnea: Secondary | ICD-10-CM | POA: Insufficient documentation

## 2012-09-17 DIAGNOSIS — I25810 Atherosclerosis of coronary artery bypass graft(s) without angina pectoris: Secondary | ICD-10-CM

## 2012-09-17 MED ORDER — TECHNETIUM TC 99M SESTAMIBI GENERIC - CARDIOLITE
31.6000 | Freq: Once | INTRAVENOUS | Status: AC | PRN
  Administered 2012-09-17: 32 via INTRAVENOUS

## 2012-09-17 MED ORDER — REGADENOSON 0.4 MG/5ML IV SOLN
0.4000 mg | Freq: Once | INTRAVENOUS | Status: AC
  Administered 2012-09-17: 0.4 mg via INTRAVENOUS

## 2012-09-17 MED ORDER — TECHNETIUM TC 99M SESTAMIBI GENERIC - CARDIOLITE
10.2000 | Freq: Once | INTRAVENOUS | Status: AC | PRN
  Administered 2012-09-17: 10 via INTRAVENOUS

## 2012-09-17 NOTE — Procedures (Addendum)
[facility] [address] [place] [address] [phone]  Cardiology Nuclear Med Study  Manuel Garner is a 79 y.o. male     MRN : [id number]     DOB: 01-29-1928  Procedure Date: 09/17/2012  Nuclear Med Background Indication for Stress Test:  Graft Patency and Abnormal EKG History:  CAD;MI;CABG--1997 Cardiac Risk Factors: Carotid Disease, Family History - CAD, History of Smoking, Lipids and PVD  Symptoms:  Dizziness, DOE, Light-Headedness, Palpitations and SOB   Nuclear Pre-Procedure Caffeine/Decaff Intake:  10:00pm NPO After: 8:00am   IV Site: R Antecubital  IV 0.9% NS with Angio Cath:  22g  Chest Size (in):  42"  IV Started by: Emmit Pomfret, RN  Height: 5\' 8"  (1.727 m)  Cup Size: n/a  BMI:  Body mass index is 24.49 kg/(m^2). Weight:  161 lb (73.029 kg)   Tech Comments:  N/A    Nuclear Med Study 1 or 2 day study: 1 day  Stress Test Type:  Lexiscan  Order Authorizing Provider:  Susa Griffins, MD   Resting Radionuclide: Technetium 50m Sestamibi  Resting Radionuclide Dose: 10.2 mCi   Stress Radionuclide:  Technetium 9m Sestamibi  Stress Radionuclide Dose: 31.6 mCi           Stress Protocol Rest HR: 56 Stress HR:  77  Rest BP: 145/90 Stress BP: 146/77  Exercise Time (min): n/a METS: n/a   Predicted Max HR: 137 bpm % Max HR: 56.2 bpm Rate Pressure Product: 21308  Dose of Adenosine (mg):  n/a Dose of Lexiscan: 0.4 mg  Dose of Atropine (mg): n/a Dose of Dobutamine: n/a mcg/kg/min (at max HR)  Stress Test Technologist: Esperanza Sheets, CCT Nuclear Technologist: Gonzella Lex, CNMT   Rest Procedure:  Myocardial perfusion imaging was performed at rest 45 minutes following the intravenous administration of Technetium 30m Sestamibi. Stress Procedure:  The patient received IV Lexiscan 0.4 mg over 15-seconds.  Technetium 23m Sestamibi injected at 30-seconds.  There were no significant changes with Lexiscan.   Quantitative spect images were obtained after a 45 minute delay.  Transient Ischemic Dilatation (Normal <1.22):  0.96 Lung/Heart Ratio (Normal <0.45):  0.23 QGS EDV:  92 ml QGS ESV:  46 ml LV Ejection Fraction: 50%  Signed by      Rest ECG: NSR-LVH  Stress ECG: No significant change from baseline ECG  QPS Raw Data Images:  Normal; no motion artifact; normal heart/lung ratio. Stress Images:  Normal homogeneous uptake in all areas of the myocardium. Rest Images:  Normal homogeneous uptake in all areas of the myocardium. Subtraction (SDS):  No evidence of ischemia.  Impression Exercise Capacity:  Lexiscan with no exercise. BP Response:  Normal blood pressure response. Clinical Symptoms:  No significant symptoms noted. ECG Impression:  ST segment changes c/w repolarization Comparison with Prior Nuclear Study: No significant change from previous study  Overall Impression:  Normal stress nuclear study.  LV Wall Motion:  NL LV Function; NL Wall Motion   Runell Gess, MD  09/17/2012 5:43 PM

## 2012-11-11 ENCOUNTER — Telehealth (HOSPITAL_COMMUNITY): Payer: Self-pay | Admitting: Cardiovascular Disease

## 2012-11-16 ENCOUNTER — Other Ambulatory Visit (HOSPITAL_COMMUNITY): Payer: Self-pay | Admitting: Cardiovascular Disease

## 2012-11-16 DIAGNOSIS — I714 Abdominal aortic aneurysm, without rupture: Secondary | ICD-10-CM

## 2012-11-23 ENCOUNTER — Ambulatory Visit (HOSPITAL_COMMUNITY)
Admission: RE | Admit: 2012-11-23 | Discharge: 2012-11-23 | Disposition: A | Discharge status: Home / Self Care | Payer: Medicare Other | Source: Ambulatory Visit | Attending: Cardiovascular Disease | Admitting: Cardiovascular Disease

## 2012-11-23 DIAGNOSIS — I714 Abdominal aortic aneurysm, without rupture, unspecified: Secondary | ICD-10-CM | POA: Insufficient documentation

## 2012-11-23 NOTE — Progress Notes (Signed)
Aorta Duplex Completed. Jaclyn Andy, RDMS, RVT  

## 2012-12-14 ENCOUNTER — Telehealth: Payer: Self-pay | Admitting: Cardiovascular Disease

## 2012-12-14 NOTE — Telephone Encounter (Signed)
In process of being completed per Neill Loft, RN.

## 2012-12-14 NOTE — Telephone Encounter (Signed)
Manuel Garner called from [facility] stating that she needs to speak to someone about multiple dx codes that are not supporting the labs that were drawn.

## 2013-03-23 ENCOUNTER — Other Ambulatory Visit: Payer: Self-pay

## 2013-03-23 MED ORDER — CITALOPRAM HYDROBROMIDE 10 MG PO TABS: 10.0000 mg | ORAL_TABLET | Freq: Every day | ORAL | Status: DC

## 2013-03-23 NOTE — Telephone Encounter (Signed)
Rx was sent to pharmacy electronically. 

## 2013-04-20 ENCOUNTER — Other Ambulatory Visit: Payer: Self-pay | Admitting: Cardiovascular Disease

## 2013-04-20 ENCOUNTER — Other Ambulatory Visit: Payer: Self-pay | Admitting: *Deleted

## 2013-04-20 NOTE — Telephone Encounter (Signed)
Refill for citalopram refused - defer to pcp

## 2013-05-05 ENCOUNTER — Telehealth: Payer: Self-pay | Admitting: Cardiology

## 2013-05-05 NOTE — Telephone Encounter (Signed)
Please call Manuel Garner concerning Thong and his wife Jana Half.

## 2013-06-04 ENCOUNTER — Other Ambulatory Visit: Payer: Self-pay | Admitting: Cardiology

## 2013-06-07 ENCOUNTER — Encounter (HOSPITAL_BASED_OUTPATIENT_CLINIC_OR_DEPARTMENT_OTHER): Payer: Self-pay | Admitting: Emergency Medicine

## 2013-06-07 ENCOUNTER — Emergency Department (HOSPITAL_BASED_OUTPATIENT_CLINIC_OR_DEPARTMENT_OTHER)
Admission: EM | Admit: 2013-06-07 | Discharge: 2013-06-07 | Disposition: A | Discharge status: Home / Self Care | Payer: Medicare Other | Attending: Emergency Medicine | Admitting: Emergency Medicine

## 2013-06-07 DIAGNOSIS — Z87442 Personal history of urinary calculi: Secondary | ICD-10-CM | POA: Insufficient documentation

## 2013-06-07 DIAGNOSIS — Z79899 Other long term (current) drug therapy: Secondary | ICD-10-CM | POA: Insufficient documentation

## 2013-06-07 DIAGNOSIS — R040 Epistaxis: Secondary | ICD-10-CM | POA: Insufficient documentation

## 2013-06-07 LAB — CBC WITH DIFFERENTIAL/PLATELET
Basophils Absolute: 0 10*3/uL (ref 0.0–0.1)
Basophils Relative: 0 % (ref 0–1)
EOS ABS: 0.1 10*3/uL (ref 0.0–0.7)
EOS PCT: 1 % (ref 0–5)
HCT: 37 % — ABNORMAL LOW (ref 39.0–52.0)
Hemoglobin: 12.2 g/dL — ABNORMAL LOW (ref 13.0–17.0)
LYMPHS ABS: 1.7 10*3/uL (ref 0.7–4.0)
Lymphocytes Relative: 16 % (ref 12–46)
MCH: 29.2 pg (ref 26.0–34.0)
MCHC: 33 g/dL (ref 30.0–36.0)
MCV: 88.5 fL (ref 78.0–100.0)
Monocytes Absolute: 0.8 10*3/uL (ref 0.1–1.0)
Monocytes Relative: 7 % (ref 3–12)
Neutro Abs: 7.9 10*3/uL — ABNORMAL HIGH (ref 1.7–7.7)
Neutrophils Relative %: 76 % (ref 43–77)
PLATELETS: 274 10*3/uL (ref 150–400)
RBC: 4.18 MIL/uL — AB (ref 4.22–5.81)
RDW: 12.3 % (ref 11.5–15.5)
WBC: 10.4 10*3/uL (ref 4.0–10.5)

## 2013-06-07 MED ORDER — OXYMETAZOLINE HCL 0.05 % NA SOLN
NASAL | Status: AC
  Administered 2013-06-07: 04:00:00
  Filled 2013-06-07: qty 15

## 2013-06-07 NOTE — ED Notes (Signed)
Warm blankets applied

## 2013-06-07 NOTE — ED Notes (Signed)
Pt reports sudden onset of nose bleeding since 7 PM unable to control bleeding states onset intermittantly over last month

## 2013-06-07 NOTE — Discharge Instructions (Signed)

## 2013-06-07 NOTE — ED Provider Notes (Addendum)
CSN: [id number]     Arrival date & time 06/07/13  0002 History   First MD Initiated Contact with Patient 06/07/13 0325     Chief Complaint  Patient presents with  . Epistaxis   (Consider location/radiation/quality/duration/timing/severity/associated sxs/prior Treatment) HPI This is an 80 year old male who developed a nosebleed at 7 PM yesterday evening. It has been bleeding intermittently over the past month, usually early in the mornings after he gets up. He has been able to stop these usually with pressure. A nosebleed that began yesterday evening was not relieved with pressure or Afrin sprayed into the nose. It bled moderately from the right side for about 4 hours until he arrived in the ED. He also had to remove clots from his nose periodically. At the present time he has been hemostatic for several hours. He has some transient weakness yesterday evening that is resolved. He never had any chest pain. He has some chronic shortness of breath but did not acutely changed. She is not on any anticoagulants.  History reviewed. No pertinent past medical history. Past Surgical History  Procedure Laterality Date  . Renal stone     History reviewed. No pertinent family history. History  Substance Use Topics  . Smoking status: Never Smoker   . Smokeless tobacco: Not on file  . Alcohol Use: No    Review of Systems  All other systems reviewed and are negative.    Allergies  Review of patient's allergies indicates no known allergies.  Home Medications   Current Outpatient Rx  Name  Route  Sig  Dispense  Refill  . citalopram (CELEXA) 10 MG tablet   Oral   Take 1 tablet (10 mg total) by mouth daily.   30 tablet   0     Patient needs to establish primary care for additi ...    BP 98/77  Pulse 87  Temp(Src) 97.5 F (36.4 C) (Oral)  Resp 20  SpO2 100%  Physical Exam General: Well-developed, well-nourished male in no acute distress; appearance consistent with age of record HENT:  normocephalic; atraumatic; no bleeding or clots seen in nares; no bleeding seen in the oropharynx Eyes: pupils equal, round and reactive to light; extraocular muscles intact Neck: supple Heart: regular rate and rhythm Lungs: clear to auscultation bilaterally Abdomen: soft; nondistended; nontender; bowel sounds present Extremities: No deformity; full range of motion; pulses normal Neurologic: Awake, alert and oriented; motor function intact in all extremities and symmetric; no facial droop Skin: Warm and dry Psychiatric: Normal mood and affect    ED Course  Procedures (including critical care time)  MDM   Nursing notes and vitals signs, including pulse oximetry, reviewed.  Summary of this visit's results, reviewed by myself:  Labs:  Results for orders placed during the hospital encounter of 06/07/13 (from the past 24 hour(s))  CBC WITH DIFFERENTIAL     Status: Abnormal   Collection Time    06/07/13  2:09 AM      Result Value Range   WBC 10.4  4.0 - 10.5 K/uL   RBC 4.18 (*) 4.22 - 5.81 MIL/uL   Hemoglobin 12.2 (*) 13.0 - 17.0 g/dL   HCT 37.0 (*) 39.0 - 52.0 %   MCV 88.5  78.0 - 100.0 fL   MCH 29.2  26.0 - 34.0 pg   MCHC 33.0  30.0 - 36.0 g/dL   RDW 12.3  11.5 - 15.5 %   Platelets 274  150 - 400 K/uL   Neutrophils Relative % 76  43 - 77 %   Neutro Abs 7.9 (*) 1.7 - 7.7 K/uL   Lymphocytes Relative 16  12 - 46 %   Lymphs Abs 1.7  0.7 - 4.0 K/uL   Monocytes Relative 7  3 - 12 %   Monocytes Absolute 0.8  0.1 - 1.0 K/uL   Eosinophils Relative 1  0 - 5 %   Eosinophils Absolute 0.1  0.0 - 0.7 K/uL   Basophils Relative 0  0 - 1 %   Basophils Absolute 0.0  0.0 - 0.1 K/uL   Patient instructed on how to drip Afrin into his nose rather than the spray or sniff should bleeding recur. We will refer him to ENT for further evaluation of the cause of his recurrent epistaxis.     Wynetta Fines, MD 06/07/13 [place], MD 06/07/13 [phone]

## 2013-08-25 ENCOUNTER — Telehealth: Payer: Self-pay | Admitting: Cardiovascular Disease

## 2013-08-25 ENCOUNTER — Other Ambulatory Visit: Payer: Self-pay | Admitting: *Deleted

## 2013-08-25 MED ORDER — PRAMIPEXOLE DIHYDROCHLORIDE 0.125 MG PO TABS: 0.1250 mg | ORAL_TABLET | Freq: Three times a day (TID) | ORAL | Status: DC

## 2013-08-25 NOTE — Telephone Encounter (Signed)
Would like to know if someone would refill his  Miratex for his restless legs .Marland Kitchen Is a former [place] Patient , have not seen anyone here as of yet. Does not have a primary ,because Dr.Weintraub was his primary  Per Lattie Haw . Please call .Marland Kitchen

## 2013-08-25 NOTE — Telephone Encounter (Signed)
Spoke with daughter, Lattie Haw. Stated she is in process of getting patient a PCP and understands that a cardiologist does not typically refill non-cardiac medications. She stated he restless leg syndrome is pretty severe and keeps him up at night without medication. Offered to send in short supply of medication and Lattie Haw will defer future non-cardiac refill requests to new PCP. States she will have new office request records as needed. RN informed daughter patient is due for appmt with new cardiologist for annual exam and informed her that a scheduler will contact her to set up an appmt.

## 2013-08-27 ENCOUNTER — Telehealth: Payer: Self-pay | Admitting: Cardiovascular Disease

## 2013-08-27 NOTE — Telephone Encounter (Signed)
Per Guy Franco paged for this call.

## 2013-08-27 NOTE — Telephone Encounter (Signed)
I spoke with Hollie.  She wanted to know why the patient was taking Mirapex. I reviewed the paper chart (the patient was seeing Dr Rollene Fare).  It was prescribed for restless leg syndrome.

## 2013-08-31 ENCOUNTER — Telehealth: Payer: Self-pay | Admitting: *Deleted

## 2013-08-31 NOTE — Telephone Encounter (Signed)
Noted  

## 2013-08-31 NOTE — Telephone Encounter (Signed)
Spoke with pt's daughter and she stated that he is going to go see his PCP first and then she will call us back to make an appointment with a provider.

## 2013-08-31 NOTE — Telephone Encounter (Signed)
Message copied by Maryan Puls on Tue Aug 31, 2013  9:51 AM ------      Message from: Fidel Levy      Created: Wed Aug 25, 2013  2:55 PM      Regarding: appmt       Patient is former patient of Dr. Rollene Fare. Last OV 08/2012 - due for yearly exam. Spoke with daughter Lattie Haw and made her aware. She should be contacted to set him up an OV.. Didn't specify which doctor.  ------

## 2013-09-11 ENCOUNTER — Other Ambulatory Visit: Payer: Self-pay | Admitting: Cardiology

## 2013-09-15 NOTE — Telephone Encounter (Signed)
Rx was sent to pharmacy electronically. Last OV 07/2012 with Dr. Rollene Fare

## 2013-10-01 ENCOUNTER — Other Ambulatory Visit: Payer: Self-pay | Admitting: *Deleted

## 2013-10-29 ENCOUNTER — Other Ambulatory Visit: Payer: Self-pay | Admitting: Cardiovascular Disease

## 2013-10-29 ENCOUNTER — Other Ambulatory Visit: Payer: Self-pay | Admitting: Cardiology

## 2013-11-05 NOTE — Telephone Encounter (Signed)
Rx was sent to pharmacy electronically. Last OV 08/2012 (Dr. Rollene Fare)

## 2013-11-05 NOTE — Telephone Encounter (Signed)
Rx was sent to pharmacy electronically. Last OV 08/2012 - Dr. Rollene Fare

## 2014-11-09 ENCOUNTER — Encounter: Payer: Self-pay | Admitting: *Deleted

## 2014-11-24 ENCOUNTER — Encounter: Payer: Self-pay | Admitting: Cardiovascular Disease

## 2017-08-26 ENCOUNTER — Other Ambulatory Visit: Payer: Self-pay | Admitting: Psychiatry

## 2017-08-26 DIAGNOSIS — M8448XA Pathological fracture, other site, initial encounter for fracture: Secondary | ICD-10-CM

## 2017-08-28 ENCOUNTER — Ambulatory Visit
Admission: RE | Admit: 2017-08-28 | Discharge: 2017-08-28 | Disposition: A | Discharge status: Home / Self Care | Payer: Medicare Other | Source: Ambulatory Visit | Attending: Psychiatry | Admitting: Psychiatry

## 2017-08-28 DIAGNOSIS — M8448XA Pathological fracture, other site, initial encounter for fracture: Secondary | ICD-10-CM

## 2017-08-28 HISTORY — PX: IR RADIOLOGIST EVAL & MGMT: IMG5224

## 2017-08-28 NOTE — Consult Note (Signed)
Chief Complaint: Bilateral sacral insufficiency fractures  Referring Physician(s): Spillane,William  History of Present Illness: Manuel Garner is a 84 y.o. male with past medical history significant for throat cancer, CAD (post myocardial infarction and CABG), COPD, hyperlipidemia and sleep apnea who was in his baseline state of well health until approximately 8 weeks ago when he suffered a fall while walking his dog.  Since that time, he has had an acute worsening of new pelvic pain.  Patient has been treated conservatively with oxycodone and Celexa however this is done little to relieve his symptoms and as such, a pelvic MRI was obtained on 08/12/2017 and demonstrated findings worrisome for bilateral sacral insufficiency fractures.  As such, patient presents today for potential percutaneous management.  He is accompanied by his daughter though serves as his own historian.  Patient has a long history of chronic back pain though states his current back pain is different and now located in his pelvis.  Prior to the fall, the patient did complain of near daily 8 out of 10 lumbar spine pain which radiated to his leg however again, states his pain is different.  He states his pain currently fluctuates between 2 out of 10 at rest getting to approximately 6-8 with prolonged movement or activity.  He states the pain is located in his posterior lower pelvis and radiates to his perineal area.  He denies lower extremity weakness or paresthesias.  No change in bowel or bladder function.  No fever or chills.  Past Medical History:  Diagnosis Date  . COPD (chronic obstructive pulmonary disease) ([place])   . Family hx of prostate cancer    with seed implant  . Hyperlipidemia   . MI (myocardial infarction) ([place])   . Sleep apnea   . Status post coronary artery bypass grafting   . Throat cancer [facility])     Past Surgical History:  Procedure Laterality Date  . BACK SURGERY    . CORONARY ARTERY BYPASS GRAFT   x3  . ESOPHAGUS SURGERY    . HERNIA REPAIR  2010  . renal stone      Allergies: Patient has no known allergies.  Medications: Prior to Admission medications   Medication Sig Start Date End Date Taking? Authorizing Provider  aspirin 81 MG tablet Take 81 mg by mouth daily.   Yes [provider]  atorvastatin (LIPITOR) 80 MG tablet Take 80 mg by mouth daily.   Yes [provider]  citalopram (CELEXA) 10 MG tablet Take 1 tablet (10 mg total) by mouth daily. 03/23/13  Yes Lorretta Harp, MD  gabapentin (NEURONTIN) 100 MG capsule Take 100 mg by mouth 2 (two) times daily.   Yes [provider]  oxyCODONE-acetaminophen (PERCOCET) 10-325 MG tablet Take 1 tablet by mouth every 8 (eight) hours as needed for pain.   Yes [provider]  pantoprazole (PROTONIX) 40 MG tablet Take 1 tablet (40 mg total) by mouth 2 (two) times daily. <please schedule appointment for refills>   Yes Lorretta Harp, MD  potassium chloride (K-DUR,KLOR-CON) 10 MEQ tablet Take 10 mEq by mouth 2 (two) times daily.   Yes [provider]  pramipexole (MIRAPEX) 0.125 MG tablet Take 1 tablet (0.125 mg total) by mouth 3 (three) times daily. 08/25/13  Yes Lorretta Harp, MD  dextromethorphan-guaiFENesin [facility] DM) 30-600 MG per 12 hr tablet Take 1 tablet by mouth 2 (two) times daily.    [provider]  ferrous sulfate 325 (65 FE) MG  tablet Take 1 tablet (325 mg total) by mouth 2 (two) times daily with a meal. <please make appointment> Patient not taking: Reported on 08/28/2017    Lorretta Harp, MD  midodrine (PROAMATINE) 5 MG tablet Take 5 mg by mouth 2 (two) times daily with a meal.    [provider]     Family History  Problem Relation Age of Onset  . Heart disease Father        37  . Heart disease Mother        61  . Cancer Brother        59  . Heart Problems Brother        75  . Stroke Sister        82    Social History   Socioeconomic  History  . Marital status: Married    Spouse name: Not on file  . Number of children: Not on file  . Years of education: Not on file  . Highest education level: Not on file  Occupational History  . Not on file  Social Needs  . Financial resource strain: Not on file  . Food insecurity:    Worry: Not on file    Inability: Not on file  . Transportation needs:    Medical: Not on file    Non-medical: Not on file  Tobacco Use  . Smoking status: Never Smoker  Substance and Sexual Activity  . Alcohol use: No  . Drug use: No  . Sexual activity: Not on file  Lifestyle  . Physical activity:    Days per week: Not on file    Minutes per session: Not on file  . Stress: Not on file  Relationships  . Social connections:    Talks on phone: Not on file    Gets together: Not on file    Attends religious service: Not on file    Active member of club or organization: Not on file    Attends meetings of clubs or organizations: Not on file    Relationship status: Not on file  Other Topics Concern  . Not on file  Social History Narrative  . Not on file    ECOG Status: 2 - Symptomatic, <50% confined to bed  Review of Systems: A 12 point ROS discussed and pertinent positives are indicated in the HPI above.  All other systems are negative.  Review of Systems  Constitutional: Positive for activity change. Negative for appetite change, fatigue and fever.  Respiratory: Negative.   Cardiovascular: Negative.   Gastrointestinal: Negative.   Musculoskeletal: Positive for back pain.    Vital Signs: BP 125/65   Pulse 69   Temp (!) 97.5 F (36.4 C)   Resp 14   Ht 5\' 7"  (1.702 m)   Wt 160 lb (72.6 kg)   SpO2 97%   BMI 25.06 kg/m   Physical Exam  Constitutional: He appears well-developed and well-nourished.  HENT:  Head: Normocephalic and atraumatic.  Cardiovascular: Normal rate and regular rhythm.  Pulmonary/Chest: Effort normal and breath sounds normal.  Musculoskeletal:        Arms: Location of the patient's pelvic pain.    Imaging:  Pelvic MRI - 08/12/2017 - there is extensive increased T2 signal seen throughout the bilateral sacral ala patible bilateral sacral insufficiency fractures.  Note is made of a nondisplaced compression fracture involving the superior endplate of the S2 vertebral body.  Additionally, there is abnormal signal involving the right L5 transverse process and  pedicle.  Labs:  CBC: No results for input(s): WBC, HGB, HCT, PLT in the last 8760 hours.  COAGS: No results for input(s): INR, APTT in the last 8760 hours.  BMP: No results for input(s): NA, K, CL, CO2, GLUCOSE, BUN, CALCIUM, CREATININE, GFRNONAA, GFRAA in the last 8760 hours.  Invalid input(s): CMP  LIVER FUNCTION TESTS: No results for input(s): BILITOT, AST, ALT, ALKPHOS, PROT, ALBUMIN in the last 8760 hours.  TUMOR MARKERS: No results for input(s): AFPTM, CEA, CA199, CHROMGRNA in the last 8760 hours.  Assessment and Plan:  Manuel Garner is a 84 y.o. male with past medical history significant for throat cancer, CAD (post myocardial infarction and CABG), COPD, hyperlipidemia and sleep apnea who was in his baseline state of well health until approximately 8 weeks ago when he suffered a fall while walking his dog.  Since that time, he has had an acute worsening of new pelvic pain.  Patient has been treated conservatively with oxycodone and Celexa however this is done little to relieve his symptoms.    Personal review of pelvic MRI performed 08/12/2017 demonstrated extensive increased T2 signal seen throughout the bilateral sacral ala patible bilateral sacral insufficiency fractures.  Note is made of a nondisplaced compression fracture involving the superior endplate of the S2 vertebral body.  Additionally, there is abnormal signal involving the right L5 transverse process and pedicle.  Given patient's symptoms as well as above MRI findings, I feel the patient is a good candidate for  image guided sacroplasty.    Risks and benefits of bilateral sacroplasty was discussed with the patient including, but not limited to education regarding the natural healing process of pelvic insufficiency fractures without intervention, bleeding, infection, cement migration which may cause nerve damage, paralysis, pulmonary embolism or even death.  I explained that this procedure would be performed to help improve his insufficiency fracture related pain and would do nothing to alleviate his chronic pre-existing back pain.  Following this prolonged the detailed conversation, the patient wishes to pursue sacroplasty.  As such, pending insurance approval, this procedure will be performed at [facility] on an outpatient basis.  Patient was encouraged to call the interventional radiology clinic with any interval questions or concerns.  Thank you for this interesting consult.  I greatly enjoyed meeting Manuel Garner and look forward to participating in their care.  A copy of this report was sent to the requesting provider on this date.  Electronically Signed: ANHAD, Manuel Garner 08/28/2017, 3:27 PM   I spent a total of 30 Minutes in face to face in clinical consultation, greater than 50% of which was counseling/coordinating care for bilateral sacral insufficiency fractures

## 2017-09-29 ENCOUNTER — Other Ambulatory Visit: Payer: Self-pay | Admitting: Interventional Radiology

## 2017-09-29 ENCOUNTER — Other Ambulatory Visit (HOSPITAL_COMMUNITY): Payer: Self-pay | Admitting: Interventional Radiology

## 2017-09-29 DIAGNOSIS — M8448XA Pathological fracture, other site, initial encounter for fracture: Secondary | ICD-10-CM

## 2017-10-01 ENCOUNTER — Ambulatory Visit
Admission: RE | Admit: 2017-10-01 | Discharge: 2017-10-01 | Disposition: A | Discharge status: Home / Self Care | Payer: Medicare Other | Source: Ambulatory Visit | Attending: Interventional Radiology | Admitting: Interventional Radiology

## 2017-10-01 ENCOUNTER — Encounter: Payer: Self-pay | Admitting: Radiology

## 2017-10-01 DIAGNOSIS — M8448XA Pathological fracture, other site, initial encounter for fracture: Secondary | ICD-10-CM

## 2017-10-01 HISTORY — PX: IR RADIOLOGIST EVAL & MGMT: IMG5224

## 2017-10-01 NOTE — Progress Notes (Signed)
Patient ID: Manuel Garner, male   DOB: [birth year], 84 y.o.   MRN: [id number]         Chief Complaint  Patient presents with  . Follow-up    3 wk follow up Sacroplasty   Referring Physician(s): Spillane  History of Present Illness: Manuel Garner is a 84 y.o. male with past medical history significant for throat cancer, CAD (post myocardial infarction and CABG), COPD, hyperlipidemia and sleep apnea who returns today to the interventional radiology clinic following a technically successful bilateral sacroplasty performed on 09/18/2017 by my partner, Dr. Earleen Newport.  The patient is accompanied by his daughter though serves as his own historian.  In brief review, the patient was in his baseline state of well health until 2 months prior to presentation when he suffered a fall while walking his dog.  Patient had been treated conservatively with oxycodone and Celexa however this is done little to relieve his symptoms and as such, a pelvic MRI was obtained on 08/12/2017 and demonstrated findings worrisome for bilateral sacral insufficiency fractures.   Ultimately, patient underwent technically successful bilateral sacroplasty on 09/18/2017 by my partner, Dr. Earleen Newport (note, I originally intended to perform the procedure however we had great difficulty obtaining insurance approval and once obtained, the patient agreed to have the procedure performed by Dr. Earleen Newport given scheduling constraints).  Patient states that he experienced near immediate complete relief of his pelvic pain following the procedure.  He has returned to all activities of daily living and is extremely pleased with the procedure.  Patient is without complaint.  Specifically, no lower extremity weakness.  No change in bowel or bladder function.  No fever or chills.    Past Medical History:  Diagnosis Date  . COPD (chronic obstructive pulmonary disease) ([place])   . Family hx of prostate cancer    with seed implant  . Hyperlipidemia   . MI  (myocardial infarction) ([place])   . Sleep apnea   . Status post coronary artery bypass grafting   . Throat cancer [facility])     Past Surgical History:  Procedure Laterality Date  . BACK SURGERY    . CORONARY ARTERY BYPASS GRAFT  x3  . ESOPHAGUS SURGERY    . HERNIA REPAIR  2010  . IR RADIOLOGIST EVAL & MGMT  08/28/2017  . renal stone      Allergies: Patient has no known allergies.  Medications: Prior to Admission medications   Medication Sig Start Date End Date Taking? Authorizing Provider  aspirin 81 MG tablet Take 81 mg by mouth daily.    [provider]  atorvastatin (LIPITOR) 80 MG tablet Take 80 mg by mouth daily.    [provider]  citalopram (CELEXA) 10 MG tablet Take 1 tablet (10 mg total) by mouth daily. 03/23/13   Lorretta Harp, MD  dextromethorphan-guaiFENesin [facility] DM) 30-600 MG per 12 hr tablet Take 1 tablet by mouth 2 (two) times daily.    [provider]  ferrous sulfate 325 (65 FE) MG tablet Take 1 tablet (325 mg total) by mouth 2 (two) times daily with a meal. <please make appointment> Patient not taking: Reported on 08/28/2017    Lorretta Harp, MD  gabapentin (NEURONTIN) 100 MG capsule Take 100 mg by mouth 2 (two) times daily.    [provider]  midodrine (PROAMATINE) 5 MG tablet Take 5 mg by mouth 2 (two) times daily with a meal.    [provider]  oxyCODONE-acetaminophen (PERCOCET) 10-325 MG tablet  Take 1 tablet by mouth every 8 (eight) hours as needed for pain.    [provider]  pantoprazole (PROTONIX) 40 MG tablet Take 1 tablet (40 mg total) by mouth 2 (two) times daily. <please schedule appointment for refills>    Lorretta Harp, MD  potassium chloride (K-DUR,KLOR-CON) 10 MEQ tablet Take 10 mEq by mouth 2 (two) times daily.    [provider]  pramipexole (MIRAPEX) 0.125 MG tablet Take 1 tablet (0.125 mg total) by mouth 3 (three) times daily. 08/25/13   Lorretta Harp, MD     Family  History  Problem Relation Age of Onset  . Heart disease Father        45  . Heart disease Mother        71  . Cancer Brother        28  . Heart Problems Brother        60  . Stroke Sister        64    Social History   Socioeconomic History  . Marital status: Married    Spouse name: Not on file  . Number of children: Not on file  . Years of education: Not on file  . Highest education level: Not on file  Occupational History  . Not on file  Social Needs  . Financial resource strain: Not on file  . Food insecurity:    Worry: Not on file    Inability: Not on file  . Transportation needs:    Medical: Not on file    Non-medical: Not on file  Tobacco Use  . Smoking status: Never Smoker  Substance and Sexual Activity  . Alcohol use: No  . Drug use: No  . Sexual activity: Not on file  Lifestyle  . Physical activity:    Days per week: Not on file    Minutes per session: Not on file  . Stress: Not on file  Relationships  . Social connections:    Talks on phone: Not on file    Gets together: Not on file    Attends religious service: Not on file    Active member of club or organization: Not on file    Attends meetings of clubs or organizations: Not on file    Relationship status: Not on file  Other Topics Concern  . Not on file  Social History Narrative  . Not on file    ECOG Status: 1 - Symptomatic but completely ambulatory  Review of Systems: A 12 point ROS discussed and pertinent positives are indicated in the HPI above.  All other systems are negative.  Review of Systems  Constitutional: Negative for activity change, appetite change and fever.  Musculoskeletal: Negative for back pain.    Vital Signs: BP 115/69   Pulse 67   Temp 97.8 F (36.6 C) (Oral)   Resp 15   SpO2 97%   Physical Exam  Constitutional: He appears well-developed and well-nourished.  HENT:  Head: Normocephalic and atraumatic.  Skin: Skin is warm and dry.  Psychiatric: He has a  normal mood and affect. His behavior is normal.      Imaging:  Selected images from pelvic MRI performed 08/22/2017 as well as intraprocedural images during bilateral sacrpplasty performed 09/18/2017 were reviewed in detail with the patient and the patient's daughter.   Labs:  CBC: No results for input(s): WBC, HGB, HCT, PLT in the last 8760 hours.  COAGS: No results for input(s): INR, APTT in the  last 8760 hours.  BMP: No results for input(s): NA, K, CL, CO2, GLUCOSE, BUN, CALCIUM, CREATININE, GFRNONAA, GFRAA in the last 8760 hours.  Invalid input(s): CMP  LIVER FUNCTION TESTS: No results for input(s): BILITOT, AST, ALT, ALKPHOS, PROT, ALBUMIN in the last 8760 hours.  TUMOR MARKERS: No results for input(s): AFPTM, CEA, CA199, CHROMGRNA in the last 8760 hours.  Assessment and Plan:  Manuel Garner is a 84 y.o. male with past medical history significant for throat cancer, CAD (post myocardial infarction and CABG), COPD, hyperlipidemia and sleep apnea who returns today to the interventional radiology clinic following a technically successful bilateral sacroplasty performed on 09/18/2017 by my partner, Dr. Earleen Newport.    Selected images from pelvic MRI performed 08/22/2017 as well as intraprocedural images during bilateral sacrpplasty performed 09/18/2017 were reviewed in detail with the patient and the patient's daughter.  The patient states that he experienced near immediate complete relief of his pelvic pain following the procedure.  He has returned to all activities of daily living and is extremely pleased with the procedure.  I explained to the patient that he may be at risk for compression fractures of either his thoracic and lumbar spine and should not hesitate to call the interventional radiology clinic if he were to experience a recurrence of this fracture related pain.  The patient and the patient's daughter demonstrated excellent understanding of this conversation.  Patient may  otherwise return to the interventional radiology clinic on a as needed basis.   Thank you for this interesting consult.  I greatly enjoyed meeting Manuel Garner and look forward to participating in their care.  A copy of this report was sent to the requesting provider on this date.  Electronically Signed: YAREL, Manuel Garner 10/01/2017, 8:53 AM   I spent a total of 10 Minutes in face to face in clinical consultation, greater than 50% of which was counseling/coordinating care for post sacroplasty.

## 2019-03-18 ENCOUNTER — Encounter (HOSPITAL_BASED_OUTPATIENT_CLINIC_OR_DEPARTMENT_OTHER): Payer: Self-pay | Admitting: Emergency Medicine

## 2019-03-18 ENCOUNTER — Emergency Department (HOSPITAL_BASED_OUTPATIENT_CLINIC_OR_DEPARTMENT_OTHER): Payer: Medicare Other

## 2019-03-18 ENCOUNTER — Emergency Department (HOSPITAL_BASED_OUTPATIENT_CLINIC_OR_DEPARTMENT_OTHER)
Admission: EM | Admit: 2019-03-18 | Discharge: 2019-03-18 | Disposition: A | Discharge status: Home / Self Care | Payer: Medicare Other | Attending: Emergency Medicine | Admitting: Emergency Medicine

## 2019-03-18 ENCOUNTER — Other Ambulatory Visit: Payer: Self-pay

## 2019-03-18 DIAGNOSIS — S0181XA Laceration without foreign body of other part of head, initial encounter: Secondary | ICD-10-CM

## 2019-03-18 DIAGNOSIS — S098XXA Other specified injuries of head, initial encounter: Secondary | ICD-10-CM | POA: Diagnosis present

## 2019-03-18 DIAGNOSIS — I252 Old myocardial infarction: Secondary | ICD-10-CM | POA: Diagnosis not present

## 2019-03-18 DIAGNOSIS — Z79899 Other long term (current) drug therapy: Secondary | ICD-10-CM | POA: Diagnosis not present

## 2019-03-18 DIAGNOSIS — S0990XA Unspecified injury of head, initial encounter: Secondary | ICD-10-CM

## 2019-03-18 DIAGNOSIS — Y999 Unspecified external cause status: Secondary | ICD-10-CM | POA: Diagnosis not present

## 2019-03-18 DIAGNOSIS — Y92512 Supermarket, store or market as the place of occurrence of the external cause: Secondary | ICD-10-CM | POA: Diagnosis not present

## 2019-03-18 DIAGNOSIS — J449 Chronic obstructive pulmonary disease, unspecified: Secondary | ICD-10-CM | POA: Insufficient documentation

## 2019-03-18 DIAGNOSIS — Y9389 Activity, other specified: Secondary | ICD-10-CM | POA: Insufficient documentation

## 2019-03-18 DIAGNOSIS — S022XXA Fracture of nasal bones, initial encounter for closed fracture: Secondary | ICD-10-CM | POA: Diagnosis not present

## 2019-03-18 DIAGNOSIS — W19XXXA Unspecified fall, initial encounter: Secondary | ICD-10-CM

## 2019-03-18 DIAGNOSIS — Z23 Encounter for immunization: Secondary | ICD-10-CM | POA: Insufficient documentation

## 2019-03-18 DIAGNOSIS — Z7982 Long term (current) use of aspirin: Secondary | ICD-10-CM | POA: Diagnosis not present

## 2019-03-18 DIAGNOSIS — W0110XA Fall on same level from slipping, tripping and stumbling with subsequent striking against unspecified object, initial encounter: Secondary | ICD-10-CM | POA: Diagnosis not present

## 2019-03-18 MED ORDER — LIDOCAINE-EPINEPHRINE-TETRACAINE (LET) TOPICAL GEL
6.0000 mL | Freq: Once | TOPICAL | Status: AC
  Administered 2019-03-18: 12:00:00 6 mL via TOPICAL

## 2019-03-18 MED ORDER — LIDOCAINE-EPINEPHRINE-TETRACAINE (LET) TOPICAL GEL
TOPICAL | Status: AC
  Administered 2019-03-18: 6 mL via TOPICAL
  Filled 2019-03-18: qty 6

## 2019-03-18 MED ORDER — LIDOCAINE HCL 1 % IJ SOLN
INTRAMUSCULAR | Status: AC
  Filled 2019-03-18: qty 20

## 2019-03-18 MED ORDER — TETANUS-DIPHTH-ACELL PERTUSSIS 5-2.5-18.5 LF-MCG/0.5 IM SUSP
0.5000 mL | Freq: Once | INTRAMUSCULAR | Status: AC
  Administered 2019-03-18: 12:00:00 0.5 mL via INTRAMUSCULAR

## 2019-03-18 MED ORDER — BACITRACIN ZINC 500 UNIT/GM EX OINT: TOPICAL_OINTMENT | Freq: Once | CUTANEOUS | Status: DC

## 2019-03-18 MED ORDER — TETANUS-DIPHTH-ACELL PERTUSSIS 5-2.5-18.5 LF-MCG/0.5 IM SUSP
INTRAMUSCULAR | Status: AC
  Administered 2019-03-18: 0.5 mL via INTRAMUSCULAR
  Filled 2019-03-18: qty 0.5

## 2019-03-18 NOTE — ED Notes (Signed)
Pt on monitor 

## 2019-03-18 NOTE — ED Triage Notes (Signed)
Pt tripped and fell on rug in a store.  Pt fell and hit his head.  Pt has laceration over right eye.  Pt c/o pain to back of head and neck.  c-collar placed.  Denies blood thinners.  Bruises to bridge of nose due to glasses.

## 2019-03-18 NOTE — ED Provider Notes (Signed)
[facility] DEPARTMENT Provider Note   CSN: [id number] Arrival date & time: 03/18/19  1031     History   Chief Complaint Chief Complaint  Patient presents with   Fall    HPI Manuel Garner is a 85 y.o. male.     Patient is a 85 year old male who presents after mechanical fall.  He was walking into a drugstore to get his flu shot and tripped on the rug going into the store, falling face forward and striking his head.  There is no loss of consciousness.  He is not on anticoagulants.  He sustained a laceration to his forehead.  He has pain in his neck.  He also has some swelling of his face.  No other injuries.  He is unsure when his last tetanus shot was.     Past Medical History:  Diagnosis Date   COPD (chronic obstructive pulmonary disease) ([place])    Family hx of prostate cancer    with seed implant   Hyperlipidemia    MI (myocardial infarction) ([place])    Sleep apnea    Status post coronary artery bypass grafting    Throat cancer ([place])     There are no active problems to display for this patient.   Past Surgical History:  Procedure Laterality Date   BACK SURGERY     CORONARY ARTERY BYPASS GRAFT  x3   ESOPHAGUS SURGERY     HERNIA REPAIR  2010   IR RADIOLOGIST EVAL & MGMT  08/28/2017   IR RADIOLOGIST EVAL & MGMT  10/01/2017   renal stone          Home Medications    Prior to Admission medications   Medication Sig Start Date End Date Taking? Authorizing Provider  aspirin 81 MG tablet Take 81 mg by mouth daily.    [provider]  atorvastatin (LIPITOR) 80 MG tablet Take 80 mg by mouth daily.    [provider]  citalopram (CELEXA) 10 MG tablet Take 1 tablet (10 mg total) by mouth daily. 03/23/13   Lorretta Harp, MD  dextromethorphan-guaiFENesin [facility] DM) 30-600 MG per 12 hr tablet Take 1 tablet by mouth 2 (two) times daily.    [provider]  ferrous sulfate 325 (65 FE) MG tablet Take 1 tablet  (325 mg total) by mouth 2 (two) times daily with a meal. <please make appointment> Patient not taking: Reported on 08/28/2017    Lorretta Harp, MD  gabapentin (NEURONTIN) 100 MG capsule Take 100 mg by mouth 2 (two) times daily.    [provider]  midodrine (PROAMATINE) 5 MG tablet Take 5 mg by mouth 2 (two) times daily with a meal.    [provider]  oxyCODONE-acetaminophen (PERCOCET) 10-325 MG tablet Take 1 tablet by mouth every 8 (eight) hours as needed for pain.    [provider]  pantoprazole (PROTONIX) 40 MG tablet Take 1 tablet (40 mg total) by mouth 2 (two) times daily. <please schedule appointment for refills>    Lorretta Harp, MD  potassium chloride (K-DUR,KLOR-CON) 10 MEQ tablet Take 10 mEq by mouth 2 (two) times daily.    [provider]  pramipexole (MIRAPEX) 0.125 MG tablet Take 1 tablet (0.125 mg total) by mouth 3 (three) times daily. 08/25/13   Lorretta Harp, MD    Family History Family History  Problem Relation Age of Onset   Heart disease Father        15  Heart disease Mother        75   Cancer Brother        44   Heart Problems Brother        90   Stroke Sister        12    Social History Social History   Tobacco Use   Smoking status: Never Smoker   Smokeless tobacco: Never Used  Substance Use Topics   Alcohol use: No   Drug use: No     Allergies   Patient has no known allergies.   Review of Systems Review of Systems  Constitutional: Negative for activity change, appetite change and fever.  HENT: Negative for dental problem, nosebleeds and trouble swallowing.   Eyes: Negative for pain and visual disturbance.  Respiratory: Negative for shortness of breath.   Cardiovascular: Negative for chest pain.  Gastrointestinal: Negative for abdominal pain, nausea and vomiting.  Genitourinary: Negative for dysuria and hematuria.  Musculoskeletal: Positive for neck pain. Negative for arthralgias, back  pain and joint swelling.  Skin: Positive for wound.  Neurological: Negative for weakness, numbness and headaches.  Psychiatric/Behavioral: Negative for confusion.     Physical Exam Updated Vital Signs BP (!) 146/61    Pulse 65    Temp 99.1 F (37.3 C) (Oral)    Resp 17    Ht 5\' 6"  (1.676 m)    Wt 72.6 kg    SpO2 98%    BMI 25.82 kg/m   Physical Exam Constitutional:      Appearance: He is well-developed.  HENT:     Head: Normocephalic.     Comments: Patient has 2 parallel lacerations to his mid forehead.  There is a small strip of skin between the wounds that is dusky in color.  There is some mild oozing from the wound.  It is 2 cm in length.  There is a adjacent 0.5 cm laceration just to the left of this wound. Eyes:     Pupils: Pupils are equal, round, and reactive to light.  Neck:     Comments: Patient has a c-collar in place.  There is some tenderness to his mid cervical spine.  There is no pain to the thoracic or lumbosacral spine.  No step-offs or deformities are noted. Cardiovascular:     Rate and Rhythm: Normal rate and regular rhythm.     Heart sounds: Normal heart sounds.  Pulmonary:     Effort: Pulmonary effort is normal. No respiratory distress.     Breath sounds: Normal breath sounds. No wheezing or rales.  Chest:     Chest wall: No tenderness.  Abdominal:     General: Bowel sounds are normal.     Palpations: Abdomen is soft.     Tenderness: There is no abdominal tenderness. There is no guarding or rebound.  Musculoskeletal: Normal range of motion.     Comments: No pain on palpation or range of motion the extremities  Lymphadenopathy:     Cervical: No cervical adenopathy.  Skin:    General: Skin is warm and dry.     Findings: No rash.  Neurological:     General: No focal deficit present.     Mental Status: He is alert and oriented to person, place, and time.      ED Treatments / Results  Labs (all labs ordered are listed, but only abnormal results are  displayed) Labs Reviewed - No data to display  EKG None  Radiology Ct Head Wo Contrast  Result Date: 03/18/2019 CLINICAL DATA:  Head trauma, minor, GCS>=13, high clinical risk, initial exam. C-spine trauma, high clinical risk. Facial trauma, fracture suspected. Fall. EXAM: CT HEAD WITHOUT CONTRAST CT MAXILLOFACIAL WITHOUT CONTRAST CT CERVICAL SPINE WITHOUT CONTRAST TECHNIQUE: Contiguous axial images were obtained from the base of the skull through the vertex without intravenous contrast. Multidetector CT imaging of the maxillofacial structures was performed. Multiplanar CT image reconstructions were also generated. A small metallic BB was placed on the right temple in order to reliably differentiate right from left. Multidetector CT imaging of the cervical spine was performed without intravenous contrast. Multiplanar CT image reconstructions were also generated. COMPARISON:  CT head/cervical spine 03/02/2015, chest CT 07/24/2010 FINDINGS: CT HEAD FINDINGS Brain: No evidence of acute intracranial hemorrhage. No demarcated cortical infarction. No evidence of intracranial mass. No midline shift or extra-axial fluid collection. Minimal ill-defined hypoattenuation within cerebral white matter is nonspecific, but consistent with chronic small vessel ischemic disease. Mild generalized parenchymal atrophy Vascular: No hyperdense vessel.  Atherosclerotic calcifications. Skull: No calvarial fracture. Other: Forehead hematoma/laceration. CT MAXILLOFACIAL FINDINGS Osseous: Bilateral mildly displaced nasal bone fractures. No other acute maxillofacial fracture is identified. Orbits: Visualized orbits demonstrate no acute abnormality. Sinuses: No significant paranasal sinus disease. Soft tissues: Forehead hematoma/laceration CT CERVICAL SPINE FINDINGS Alignment: No significant spondylolisthesis. Skull base and vertebrae: The basion-dental and atlanto-dental intervals are maintained.No evidence of acute fracture to the  cervical spine. A mild T2 superior endplate deformity is new from prior chest CT 07/24/2010 but otherwise age indeterminate. Soft tissues and spinal canal: No prevertebral fluid or swelling. No visible canal hematoma. Disc levels: Prominent degenerative change of the C1-C2 articulation. Cervical spondylosis with multilevel posterior disc osteophytes, uncovertebral and facet hypertrophy. Disc degeneration is severe at the C5-C6 through C7-T1 level. No high-grade bony spinal canal stenosis. Upper chest: No consolidation within the imaged lung apices. No visible pneumothorax. IMPRESSION: CT head: 1. No evidence of acute intracranial abnormality. 2. Mild generalized parenchymal atrophy and chronic small vessel ischemic disease. 3. Forehead hematoma/laceration. CT maxillofacial: Mildly displaced bilateral nasal bone fractures. CT cervical spine: 1. No evidence of acute fracture to the cervical spine 2. A mild T2 superior endplate deformity is new from prior CT chest 07/24/2010, but otherwise age indeterminate. 3. Cervical spondylosis as described. Electronically Signed   By: Kellie Simmering DO   On: 03/18/2019 13:47   Ct Cervical Spine Wo Contrast  Result Date: 03/18/2019 CLINICAL DATA:  Head trauma, minor, GCS>=13, high clinical risk, initial exam. C-spine trauma, high clinical risk. Facial trauma, fracture suspected. Fall. EXAM: CT HEAD WITHOUT CONTRAST CT MAXILLOFACIAL WITHOUT CONTRAST CT CERVICAL SPINE WITHOUT CONTRAST TECHNIQUE: Contiguous axial images were obtained from the base of the skull through the vertex without intravenous contrast. Multidetector CT imaging of the maxillofacial structures was performed. Multiplanar CT image reconstructions were also generated. A small metallic BB was placed on the right temple in order to reliably differentiate right from left. Multidetector CT imaging of the cervical spine was performed without intravenous contrast. Multiplanar CT image reconstructions were also  generated. COMPARISON:  CT head/cervical spine 03/02/2015, chest CT 07/24/2010 FINDINGS: CT HEAD FINDINGS Brain: No evidence of acute intracranial hemorrhage. No demarcated cortical infarction. No evidence of intracranial mass. No midline shift or extra-axial fluid collection. Minimal ill-defined hypoattenuation within cerebral white matter is nonspecific, but consistent with chronic small vessel ischemic disease. Mild generalized parenchymal atrophy Vascular: No hyperdense vessel.  Atherosclerotic calcifications. Skull: No calvarial fracture. Other: Forehead hematoma/laceration. CT MAXILLOFACIAL FINDINGS Osseous: Bilateral mildly  displaced nasal bone fractures. No other acute maxillofacial fracture is identified. Orbits: Visualized orbits demonstrate no acute abnormality. Sinuses: No significant paranasal sinus disease. Soft tissues: Forehead hematoma/laceration CT CERVICAL SPINE FINDINGS Alignment: No significant spondylolisthesis. Skull base and vertebrae: The basion-dental and atlanto-dental intervals are maintained.No evidence of acute fracture to the cervical spine. A mild T2 superior endplate deformity is new from prior chest CT 07/24/2010 but otherwise age indeterminate. Soft tissues and spinal canal: No prevertebral fluid or swelling. No visible canal hematoma. Disc levels: Prominent degenerative change of the C1-C2 articulation. Cervical spondylosis with multilevel posterior disc osteophytes, uncovertebral and facet hypertrophy. Disc degeneration is severe at the C5-C6 through C7-T1 level. No high-grade bony spinal canal stenosis. Upper chest: No consolidation within the imaged lung apices. No visible pneumothorax. IMPRESSION: CT head: 1. No evidence of acute intracranial abnormality. 2. Mild generalized parenchymal atrophy and chronic small vessel ischemic disease. 3. Forehead hematoma/laceration. CT maxillofacial: Mildly displaced bilateral nasal bone fractures. CT cervical spine: 1. No evidence of acute  fracture to the cervical spine 2. A mild T2 superior endplate deformity is new from prior CT chest 07/24/2010, but otherwise age indeterminate. 3. Cervical spondylosis as described. Electronically Signed   By: Kellie Simmering DO   On: 03/18/2019 13:47   Ct Maxillofacial Wo Contrast  Result Date: 03/18/2019 CLINICAL DATA:  Head trauma, minor, GCS>=13, high clinical risk, initial exam. C-spine trauma, high clinical risk. Facial trauma, fracture suspected. Fall. EXAM: CT HEAD WITHOUT CONTRAST CT MAXILLOFACIAL WITHOUT CONTRAST CT CERVICAL SPINE WITHOUT CONTRAST TECHNIQUE: Contiguous axial images were obtained from the base of the skull through the vertex without intravenous contrast. Multidetector CT imaging of the maxillofacial structures was performed. Multiplanar CT image reconstructions were also generated. A small metallic BB was placed on the right temple in order to reliably differentiate right from left. Multidetector CT imaging of the cervical spine was performed without intravenous contrast. Multiplanar CT image reconstructions were also generated. COMPARISON:  CT head/cervical spine 03/02/2015, chest CT 07/24/2010 FINDINGS: CT HEAD FINDINGS Brain: No evidence of acute intracranial hemorrhage. No demarcated cortical infarction. No evidence of intracranial mass. No midline shift or extra-axial fluid collection. Minimal ill-defined hypoattenuation within cerebral white matter is nonspecific, but consistent with chronic small vessel ischemic disease. Mild generalized parenchymal atrophy Vascular: No hyperdense vessel.  Atherosclerotic calcifications. Skull: No calvarial fracture. Other: Forehead hematoma/laceration. CT MAXILLOFACIAL FINDINGS Osseous: Bilateral mildly displaced nasal bone fractures. No other acute maxillofacial fracture is identified. Orbits: Visualized orbits demonstrate no acute abnormality. Sinuses: No significant paranasal sinus disease. Soft tissues: Forehead hematoma/laceration CT CERVICAL  SPINE FINDINGS Alignment: No significant spondylolisthesis. Skull base and vertebrae: The basion-dental and atlanto-dental intervals are maintained.No evidence of acute fracture to the cervical spine. A mild T2 superior endplate deformity is new from prior chest CT 07/24/2010 but otherwise age indeterminate. Soft tissues and spinal canal: No prevertebral fluid or swelling. No visible canal hematoma. Disc levels: Prominent degenerative change of the C1-C2 articulation. Cervical spondylosis with multilevel posterior disc osteophytes, uncovertebral and facet hypertrophy. Disc degeneration is severe at the C5-C6 through C7-T1 level. No high-grade bony spinal canal stenosis. Upper chest: No consolidation within the imaged lung apices. No visible pneumothorax. IMPRESSION: CT head: 1. No evidence of acute intracranial abnormality. 2. Mild generalized parenchymal atrophy and chronic small vessel ischemic disease. 3. Forehead hematoma/laceration. CT maxillofacial: Mildly displaced bilateral nasal bone fractures. CT cervical spine: 1. No evidence of acute fracture to the cervical spine 2. A mild T2 superior endplate deformity is new  from prior CT chest 07/24/2010, but otherwise age indeterminate. 3. Cervical spondylosis as described. Electronically Signed   By: Kellie Simmering DO   On: 03/18/2019 13:47    Procedures .Marland KitchenLaceration Repair  Date/Time: 03/18/2019 3:10 PM Performed by: Malvin Johns, MD Authorized by: Malvin Johns, MD   Consent:    Consent obtained:  Written   Consent given by:  Patient   Risks discussed:  Infection, pain, poor cosmetic result, poor wound healing, need for additional repair and retained foreign body   Alternatives discussed:  No treatment Anesthesia (see MAR for exact dosages):    Anesthesia method:  Topical application and local infiltration   Topical anesthetic:  LET   Local anesthetic:  Lidocaine 1% w/o epi Laceration details:    Location: forehead.   Length (cm):  2 Repair  type:    Repair type:  Simple Pre-procedure details:    Preparation:  Patient was prepped and draped in usual sterile fashion and imaging obtained to evaluate for foreign bodies Exploration:    Hemostasis achieved with:  LET   Wound exploration: entire depth of wound probed and visualized     Wound extent: no fascia violation noted, no foreign bodies/material noted, no muscle damage noted, no nerve damage noted, no underlying fracture noted and no vascular damage noted     Contaminated: no   Treatment:    Area cleansed with:  Saline   Amount of cleaning:  Standard   Irrigation solution:  Sterile saline   Irrigation volume:  300   Irrigation method:  Syringe   Visualized foreign bodies/material removed: no   Skin repair:    Repair method:  Sutures   Suture size:  5-0   Suture material:  Prolene   Suture technique:  Simple interrupted   Number of sutures:  5 Approximation:    Approximation:  Close Post-procedure details:    Dressing:  Antibiotic ointment   Patient tolerance of procedure:  Tolerated well, no immediate complications Comments:     2cm wound repaired with 4 sutures 0.5 cm wound repaired with 1 suture.   (including critical care time)  Medications Ordered in ED Medications  lidocaine (XYLOCAINE) 1 % (with pres) injection (has no administration in time range)  bacitracin ointment (has no administration in time range)  Tdap (BOOSTRIX) injection 0.5 mL (0.5 mLs Intramuscular Given 03/18/19 1152)  lidocaine-EPINEPHrine-tetracaine (LET) topical gel (6 mLs Topical Given 03/18/19 1153)     Initial Impression / Assessment and Plan / ED Course  I have reviewed the triage vital signs and the nursing notes.  Pertinent labs & imaging results that were available during my care of the patient were reviewed by me and considered in my medical decision making (see chart for details).        CT scans show evidence of nasal bone fractures, no intracranial hemorrhage, no  cervical fractures.  There T2 endplate fracture of uncertain age, the patient does not have any tenderness to this area.  The lacerations to his forehead were repaired.  I explained to the patient that he may need to follow-up with an ENT regarding his nasal bone fractures if there is any concerns once the swelling goes down.  He was given wound care instructions and head injury precautions.  He was advised to have the sutures removed in about 1 week.  Return precautions were given.  His tetanus shot was updated.  Final Clinical Impressions(s) / ED Diagnoses   Final diagnoses:  Fall, initial encounter  Closed  fracture of nasal bone, initial encounter  Injury of head, initial encounter  Facial laceration, initial encounter    ED Discharge Orders    None       Malvin Johns, MD 03/18/19 [id number]

## 2019-03-18 NOTE — ED Notes (Signed)
ED Provider at bedside. 

## 2019-03-18 NOTE — Discharge Instructions (Addendum)
Keep your wound dry for 24 hours, you can wash your scalp normally after that.  You need to have the sutures removed in about a week.  This can be done by your primary care doctor.  Return here as needed if you have any worsening symptoms including worsening headaches, vomiting, redness or drainage from the wound, or other worsening symptoms.  Follow-up with the ENT regarding your nasal fracture as needed.

## 2019-06-09 ENCOUNTER — Emergency Department (HOSPITAL_BASED_OUTPATIENT_CLINIC_OR_DEPARTMENT_OTHER)
Admission: EM | Admit: 2019-06-09 | Discharge: 2019-06-09 | Disposition: A | Discharge status: Home / Self Care | Payer: Medicare Other | Attending: Emergency Medicine | Admitting: Emergency Medicine

## 2019-06-09 ENCOUNTER — Encounter (HOSPITAL_BASED_OUTPATIENT_CLINIC_OR_DEPARTMENT_OTHER): Payer: Self-pay | Admitting: Emergency Medicine

## 2019-06-09 ENCOUNTER — Emergency Department (HOSPITAL_BASED_OUTPATIENT_CLINIC_OR_DEPARTMENT_OTHER): Payer: Medicare Other

## 2019-06-09 ENCOUNTER — Other Ambulatory Visit: Payer: Self-pay

## 2019-06-09 DIAGNOSIS — E785 Hyperlipidemia, unspecified: Secondary | ICD-10-CM | POA: Insufficient documentation

## 2019-06-09 DIAGNOSIS — R072 Precordial pain: Secondary | ICD-10-CM | POA: Diagnosis not present

## 2019-06-09 DIAGNOSIS — J449 Chronic obstructive pulmonary disease, unspecified: Secondary | ICD-10-CM | POA: Insufficient documentation

## 2019-06-09 DIAGNOSIS — R0789 Other chest pain: Secondary | ICD-10-CM | POA: Diagnosis present

## 2019-06-09 DIAGNOSIS — W19XXXA Unspecified fall, initial encounter: Secondary | ICD-10-CM | POA: Diagnosis not present

## 2019-06-09 DIAGNOSIS — Z20822 Contact with and (suspected) exposure to covid-19: Secondary | ICD-10-CM | POA: Insufficient documentation

## 2019-06-09 DIAGNOSIS — Y929 Unspecified place or not applicable: Secondary | ICD-10-CM | POA: Insufficient documentation

## 2019-06-09 DIAGNOSIS — Y939 Activity, unspecified: Secondary | ICD-10-CM | POA: Insufficient documentation

## 2019-06-09 DIAGNOSIS — Z79899 Other long term (current) drug therapy: Secondary | ICD-10-CM | POA: Insufficient documentation

## 2019-06-09 DIAGNOSIS — I252 Old myocardial infarction: Secondary | ICD-10-CM | POA: Diagnosis not present

## 2019-06-09 DIAGNOSIS — S0990XA Unspecified injury of head, initial encounter: Secondary | ICD-10-CM | POA: Insufficient documentation

## 2019-06-09 DIAGNOSIS — Z951 Presence of aortocoronary bypass graft: Secondary | ICD-10-CM | POA: Diagnosis not present

## 2019-06-09 DIAGNOSIS — Z7982 Long term (current) use of aspirin: Secondary | ICD-10-CM | POA: Diagnosis not present

## 2019-06-09 DIAGNOSIS — Y999 Unspecified external cause status: Secondary | ICD-10-CM | POA: Diagnosis not present

## 2019-06-09 LAB — CBC WITH DIFFERENTIAL/PLATELET
Abs Immature Granulocytes: 0.05 10*3/uL (ref 0.00–0.07)
Basophils Absolute: 0 10*3/uL (ref 0.0–0.1)
Basophils Relative: 0 %
Eosinophils Absolute: 0.2 10*3/uL (ref 0.0–0.5)
Eosinophils Relative: 2 %
HCT: 37.3 % — ABNORMAL LOW (ref 39.0–52.0)
Hemoglobin: 12.2 g/dL — ABNORMAL LOW (ref 13.0–17.0)
Immature Granulocytes: 1 %
Lymphocytes Relative: 14 %
Lymphs Abs: 1.4 10*3/uL (ref 0.7–4.0)
MCH: 32.3 pg (ref 26.0–34.0)
MCHC: 32.7 g/dL (ref 30.0–36.0)
MCV: 98.7 fL (ref 80.0–100.0)
Monocytes Absolute: 1.1 10*3/uL — ABNORMAL HIGH (ref 0.1–1.0)
Monocytes Relative: 11 %
Neutro Abs: 7.4 10*3/uL (ref 1.7–7.7)
Neutrophils Relative %: 72 %
Platelets: 210 10*3/uL (ref 150–400)
RBC: 3.78 MIL/uL — ABNORMAL LOW (ref 4.22–5.81)
RDW: 13 % (ref 11.5–15.5)
WBC: 10.2 10*3/uL (ref 4.0–10.5)
nRBC: 0 % (ref 0.0–0.2)

## 2019-06-09 LAB — BASIC METABOLIC PANEL
Anion gap: 7 (ref 5–15)
BUN: 25 mg/dL — ABNORMAL HIGH (ref 8–23)
CO2: 27 mmol/L (ref 22–32)
Calcium: 9.1 mg/dL (ref 8.9–10.3)
Chloride: 101 mmol/L (ref 98–111)
Creatinine, Ser: 1.24 mg/dL (ref 0.61–1.24)
GFR calc Af Amer: 59 mL/min — ABNORMAL LOW (ref >60)
GFR calc non Af Amer: 51 mL/min — ABNORMAL LOW (ref >60)
Glucose, Bld: 110 mg/dL — ABNORMAL HIGH (ref 70–99)
Potassium: 4.3 mmol/L (ref 3.5–5.1)
Sodium: 135 mmol/L (ref 135–145)

## 2019-06-09 LAB — TROPONIN I (HIGH SENSITIVITY)
Troponin I (High Sensitivity): 13 ng/L (ref <18)
Troponin I (High Sensitivity): 13 ng/L (ref <18)

## 2019-06-09 LAB — SARS CORONAVIRUS 2 BY RT PCR (HOSPITAL ORDER, PERFORMED IN ~~LOC~~ HOSPITAL LAB): SARS Coronavirus 2: NEGATIVE

## 2019-06-09 MED ORDER — DICLOFENAC SODIUM 1 % EX GEL: 4.0000 g | Freq: Four times a day (QID) | CUTANEOUS | 0 refills | Status: DC

## 2019-06-09 MED ORDER — ALUM & MAG HYDROXIDE-SIMETH 200-200-20 MG/5ML PO SUSP
30.0000 mL | Freq: Once | ORAL | Status: AC
  Administered 2019-06-09: 30 mL via ORAL
  Filled 2019-06-09: qty 30

## 2019-06-09 MED ORDER — IOHEXOL 350 MG/ML SOLN
100.0000 mL | Freq: Once | INTRAVENOUS | Status: AC | PRN
  Administered 2019-06-09: 100 mL via INTRAVENOUS

## 2019-06-09 MED ORDER — LIDOCAINE 5 % EX PTCH: 1.0000 | MEDICATED_PATCH | CUTANEOUS | 0 refills | Status: DC

## 2019-06-09 MED ORDER — KETOROLAC TROMETHAMINE 30 MG/ML IJ SOLN
15.0000 mg | Freq: Once | INTRAMUSCULAR | Status: AC
  Administered 2019-06-09: 15 mg via INTRAVENOUS
  Filled 2019-06-09: qty 1

## 2019-06-09 MED ORDER — ASPIRIN 81 MG PO CHEW
324.0000 mg | CHEWABLE_TABLET | Freq: Once | ORAL | Status: AC
  Administered 2019-06-09: 324 mg via ORAL
  Filled 2019-06-09: qty 4

## 2019-06-09 MED ORDER — DICYCLOMINE HCL 10 MG PO CAPS
10.0000 mg | ORAL_CAPSULE | Freq: Once | ORAL | Status: AC
  Administered 2019-06-09: 10 mg via ORAL
  Filled 2019-06-09: qty 1

## 2019-06-09 MED ORDER — ACETAMINOPHEN 500 MG PO TABS
1000.0000 mg | ORAL_TABLET | Freq: Once | ORAL | Status: AC
  Administered 2019-06-09: 1000 mg via ORAL
  Filled 2019-06-09: qty 2

## 2019-06-09 NOTE — ED Notes (Signed)
Returned from CT.

## 2019-06-09 NOTE — ED Provider Notes (Signed)
Second troponin is stable and negative.  Patient is asleep.  Discussed work-up with family and plan for discharge and follow-up with PCP.   Sherwood Gambler, MD 06/09/19 [id number]

## 2019-06-09 NOTE — ED Triage Notes (Signed)
Pt c/o left sided chest pain that started while sleeping tonight. Pt had a fall x 2 days per daughter. Pt fell backward on hardwood floor. Pt also c/o shob.

## 2019-06-09 NOTE — ED Provider Notes (Signed)
[facility] EMERGENCY DEPARTMENT Provider Note   CSN: [id number] Arrival date & time: 06/09/19  [id number]     History No chief complaint on file.   Manuel Garner is a 86 y.o. male.  The history is provided by the patient and a relative.  Chest Pain Pain location:  L chest Pain quality: sharp   Pain radiates to:  Does not radiate Pain severity:  Moderate Onset quality:  Gradual Timing:  Constant Progression:  Unchanged Chronicity:  New Context: at rest   Relieved by:  Nothing Worsened by:  Nothing Ineffective treatments:  None tried Associated symptoms: no abdominal pain, no AICD problem, no altered mental status, no anorexia, no anxiety, no claudication, no cough, no diaphoresis, no dizziness, no dysphagia, no fatigue, no fever, no headache, no heartburn, no lower extremity edema, no nausea, no near-syncope, no numbness, no orthopnea, no palpitations, no PND and no weakness   Risk factors: male sex   Fall This is a recurrent problem. The current episode started more than 2 days ago. The problem occurs rarely. The problem has been resolved. Associated symptoms include chest pain. Pertinent negatives include no abdominal pain and no headaches. Nothing aggravates the symptoms. Nothing relieves the symptoms. He has tried acetaminophen for the symptoms. The treatment provided no relief.  Patient fell backwards on Sunday striking head and posterior ribs.  Has been hurting since but then developed sharp LU chest pain last night pain was a "20/ 10.".  No radiation.  No n/v/d.  No exertional symptoms.  No Cough.  No f/c/r.  No loss of taste or smell.       Past Medical History:  Diagnosis Date  . COPD (chronic obstructive pulmonary disease) (HCC)   . Family hx of prostate cancer    with seed implant  . Hyperlipidemia   . MI (myocardial infarction) (HCC)   . Sleep apnea   . Status post coronary artery bypass grafting   . Throat cancer (HCC)     There are no problems to  display for this patient.   Past Surgical History:  Procedure Laterality Date  . BACK SURGERY    . CORONARY ARTERY BYPASS GRAFT  x3  . ESOPHAGUS SURGERY    . HERNIA REPAIR  2010  . IR RADIOLOGIST EVAL & MGMT  08/28/2017  . IR RADIOLOGIST EVAL & MGMT  10/01/2017  . renal stone         Family History  Problem Relation Age of Onset  . Heart disease Father        86  . Heart disease Mother        59  . Cancer Brother        65  . Heart Problems Brother        82  . Stroke Sister        79     Social History   Tobacco Use  . Smoking status: Never Smoker  . Smokeless tobacco: Never Used  Substance Use Topics  . Alcohol use: No  . Drug use: No    Home Medications Prior to Admission medications   Medication Sig Start Date End Date Taking? Authorizing Provider  aspirin 81 MG tablet Take 81 mg by mouth daily.    [provider]  atorvastatin (LIPITOR) 80 MG tablet Take 80 mg by mouth daily.    [provider]  citalopram (CELEXA) 10 MG tablet Take 1 tablet (10 mg total) by mouth daily. 03/23/13   Quay Burow  J, MD  dextromethorphan-guaiFENesin (MUCINEX DM) 30-600 MG per 12 hr tablet Take 1 tablet by mouth 2 (two) times daily.    [provider]  ferrous sulfate 325 (65 FE) MG tablet Take 1 tablet (325 mg total) by mouth 2 (two) times daily with a meal. <please make appointment> Patient not taking: Reported on 08/28/2017    Lorretta Harp, MD  gabapentin (NEURONTIN) 100 MG capsule Take 100 mg by mouth 2 (two) times daily.    [provider]  midodrine (PROAMATINE) 5 MG tablet Take 5 mg by mouth 2 (two) times daily with a meal.    [provider]  oxyCODONE-acetaminophen (PERCOCET) 10-325 MG tablet Take 1 tablet by mouth every 8 (eight) hours as needed for pain.    [provider]  pantoprazole (PROTONIX) 40 MG tablet Take 1 tablet (40 mg total) by mouth 2 (two) times daily. <please schedule appointment for refills>     Lorretta Harp, MD  potassium chloride (K-DUR,KLOR-CON) 10 MEQ tablet Take 10 mEq by mouth 2 (two) times daily.    [provider]  pramipexole (MIRAPEX) 0.125 MG tablet Take 1 tablet (0.125 mg total) by mouth 3 (three) times daily. 08/25/13   Lorretta Harp, MD    Allergies    Patient has no known allergies.  Review of Systems   Review of Systems  Constitutional: Negative for diaphoresis, fatigue and fever.  HENT: Negative for congestion and trouble swallowing.   Eyes: Negative for visual disturbance.  Respiratory: Negative for cough.   Cardiovascular: Positive for chest pain. Negative for palpitations, orthopnea, claudication, leg swelling, PND and near-syncope.  Gastrointestinal: Negative for abdominal pain, anorexia, heartburn and nausea.  Genitourinary: Negative for difficulty urinating.  Musculoskeletal: Positive for arthralgias.  Skin: Negative for color change.  Neurological: Negative for dizziness, weakness, numbness and headaches.  Psychiatric/Behavioral: Negative for agitation.  All other systems reviewed and are negative.   Physical Exam Updated Vital Signs There were no vitals taken for this visit.  Physical Exam Vitals and nursing note reviewed.  Constitutional:      General: He is not in acute distress.    Appearance: Normal appearance. He is not diaphoretic.  HENT:     Head: Normocephalic and atraumatic.     Nose: Nose normal.  Eyes:     Extraocular Movements: Extraocular movements intact.     Conjunctiva/sclera: Conjunctivae normal.     Pupils: Pupils are equal, round, and reactive to light.  Cardiovascular:     Rate and Rhythm: Normal rate and regular rhythm.     Pulses: Normal pulses.     Heart sounds: Normal heart sounds.  Pulmonary:     Effort: Pulmonary effort is normal. No respiratory distress.     Breath sounds: Normal breath sounds. No wheezing.  Abdominal:     General: Abdomen is flat. Bowel sounds are normal.     Tenderness:  There is no abdominal tenderness. There is no guarding or rebound.  Musculoskeletal:        General: No tenderness. Normal range of motion.     Cervical back: Normal range of motion and neck supple.     Right lower leg: No edema.     Left lower leg: No edema.  Skin:    General: Skin is warm and dry.     Capillary Refill: Capillary refill takes less than 2 seconds.  Neurological:     General: No focal deficit present.     Mental Status: He is  alert.     Deep Tendon Reflexes: Reflexes normal.  Psychiatric:        Thought Content: Thought content normal.     ED Results / Procedures / Treatments   Labs (all labs ordered are listed, but only abnormal results are displayed) Results for orders placed or performed during the hospital encounter of 06/09/19  SARS Coronavirus 2 by RT PCR (hospital order, performed in [facility] hospital lab) Nasopharyngeal Nasopharyngeal Swab   Specimen: Nasopharyngeal Swab  Result Value Ref Range   SARS Coronavirus 2 NEGATIVE NEGATIVE  CBC with Differential/Platelet  Result Value Ref Range   WBC 10.2 4.0 - 10.5 K/uL   RBC 3.78 (L) 4.22 - 5.81 MIL/uL   Hemoglobin 12.2 (L) 13.0 - 17.0 g/dL   HCT 37.3 (L) 39.0 - 52.0 %   MCV 98.7 80.0 - 100.0 fL   MCH 32.3 26.0 - 34.0 pg   MCHC 32.7 30.0 - 36.0 g/dL   RDW 13.0 11.5 - 15.5 %   Platelets 210 150 - 400 K/uL   nRBC 0.0 0.0 - 0.2 %   Neutrophils Relative % 72 %   Neutro Abs 7.4 1.7 - 7.7 K/uL   Lymphocytes Relative 14 %   Lymphs Abs 1.4 0.7 - 4.0 K/uL   Monocytes Relative 11 %   Monocytes Absolute 1.1 (H) 0.1 - 1.0 K/uL   Eosinophils Relative 2 %   Eosinophils Absolute 0.2 0.0 - 0.5 K/uL   Basophils Relative 0 %   Basophils Absolute 0.0 0.0 - 0.1 K/uL   Immature Granulocytes 1 %   Abs Immature Granulocytes 0.05 0.00 - 0.07 K/uL  Basic metabolic panel  Result Value Ref Range   Sodium 135 135 - 145 mmol/L   Potassium 4.3 3.5 - 5.1 mmol/L   Chloride 101 98 - 111 mmol/L   CO2 27 22 - 32 mmol/L    Glucose, Bld 110 (H) 70 - 99 mg/dL   BUN 25 (H) 8 - 23 mg/dL   Creatinine, Ser 1.24 0.61 - 1.24 mg/dL   Calcium 9.1 8.9 - 10.3 mg/dL   GFR calc non Af Amer 51 (L) >60 mL/min   GFR calc Af Amer 59 (L) >60 mL/min   Anion gap 7 5 - 15  Troponin I (High Sensitivity)  Result Value Ref Range   Troponin I (High Sensitivity) 13 <18 ng/L   CT Head Wo Contrast  Result Date: 06/09/2019 CLINICAL DATA:  Fall 2 days prior, left head neck pain EXAM: CT HEAD WITHOUT CONTRAST CT CERVICAL SPINE WITHOUT CONTRAST TECHNIQUE: Multidetector CT imaging of the head and cervical spine was performed following the standard protocol without intravenous contrast. Multiplanar CT image reconstructions of the cervical spine were also generated. COMPARISON:  CT head and cervical spine 03/18/2019 FINDINGS: CT HEAD FINDINGS Brain: No evidence of acute infarction, hemorrhage, hydrocephalus, extra-axial collection or mass lesion/mass effect. Symmetric prominence of the ventricles, cisterns and sulci compatible with parenchymal volume loss. Patchy areas of white matter hypoattenuation are most compatible with chronic microvascular angiopathy. Vascular: Atherosclerotic calcification of the carotid siphons and intradural vertebral arteries. No hyperdense vessel. Skull: No calvarial fracture or suspicious osseous lesion. No scalp swelling or hematoma. Sinuses/Orbits: Paranasal sinuses and mastoid air cells are predominantly clear. Orbital structures are unremarkable aside from prior lens extractions. Other: None CT CERVICAL SPINE FINDINGS Alignment: Preservation of the normal cervical lordosis. Mild anterolisthesis of C4 on C5 and retrolisthesis of C5 on C6 is unchanged from comparison and likely on a degenerative basis.  No traumatic listhesis. No abnormally widened, perched or jumped facets. Craniocervical atlantoaxial articulations are normally aligned. Skull base and vertebrae: Severe degenerative changes at the atlantodental interval with  extensive sclerotic change. T2 superior endplate deformity is stable from prior. No acute vertebral body fracture or height loss. No skull base fracture is seen. Soft tissues and spinal canal: No pre or paravertebral fluid or swelling. No visible canal hematoma. Disc levels: Multilevel cervical spondylitic changes are present throughout the cervical spine including prominent degenerative change of the C1-2 articulation. There is cervical spondylosis with multilevel posterior disc osteophyte formation, uncinate spurring and facet hypertrophic change. These features are most pronounced from C5-T1 and result in at most mild canal stenosis at the C5-6 level. There is at least mild multilevel bilateral neural foraminal narrowing. Upper chest: No acute abnormality in the upper chest or imaged lung apices. Other: Normal thyroid. Cervical carotid atherosclerosis. IMPRESSION: 1. No acute intracranial abnormality. 2. Chronic microvascular angiopathy and parenchymal volume loss. 3. No acute cervical spine fracture or traumatic listhesis. 4. Stable multilevel cervical spondylitic changes as detailed above. 5. Stable remote T2 superior endplate deformity. 6. Cervical and intracranial atherosclerosis. Electronically Signed   By: Lovena Le M.D.   On: 06/09/2019 06:15   CT Angio Chest PE W and/or Wo Contrast  Result Date: 06/09/2019 CLINICAL DATA:  Fall 2 days ago, left chest and rib pain EXAM: CT ANGIOGRAPHY CHEST WITH CONTRAST TECHNIQUE: Multidetector CT imaging of the chest was performed using the standard protocol during bolus administration of intravenous contrast. Multiplanar CT image reconstructions and MIPs were obtained to evaluate the vascular anatomy. CONTRAST:  153mL OMNIPAQUE IOHEXOL 350 MG/ML SOLN COMPARISON:  Chest radiograph 06/09/2019, CT chest 07/24/2010 FINDINGS: Cardiovascular: Satisfactory opacification the pulmonary arteries to the segmental level. No pulmonary artery filling defects are identified.  Central pulmonary arteries are upper limits normal caliber. Mild cardiomegaly. No pericardial effusion. Three-vessel coronary artery calcifications are noted. Dense mitral annular calcification is present as well. Postsurgical changes from prior CABG. Atherosclerotic plaque within the normal caliber aorta. Calcification also present within the normally branching great vessels. No major venous abnormality is seen. Mediastinum/Nodes: Patulous, fluid-filled thoracic esophagus with a moderate hiatal hernia. Postsurgical changes of proximal stomach noted as well. No acute abnormality of the trachea, posterior bowing related to imaging during exhalation for the angiographic technique. Thyroid gland and thoracic inlet are unremarkable. No worrisome mediastinal, hilar or axillary adenopathy. Lungs/Pleura: There is stable regions of scarring and architectural distortion involving the periphery of the left lower lobe. Some superimposed atelectatic changes are present in both lungs likely related to imaging during exhalation. No focal consolidative opacity is seen. No convincing features of edema. No pneumothorax or effusion. No worrisome nodules or masses. Upper Abdomen: Hiatal hernia with postsurgical changes of the distal thoracic esophagus and stomach. Cholelithiasis without pericholecystic inflammation or visible ductal dilatation. Nonobstructing left nephrolithiasis. Partial atrophy of the pancreas. Musculoskeletal: Post sternotomy changes. Postsurgical changes of the left chest wall including chronic left sixth and eighth rib deformity and left seventh rib resection. No acute or suspicious osseous injury or lesion is seen. Multilevel degenerative changes are present in the imaged portions of the spine. Additional degenerative changes in shoulders. Review of the MIP images confirms the above findings. IMPRESSION: 1. No evidence of acute pulmonary artery filling defect. 2. No acute or suspicious osseous injury identified.  Postsurgical changes of the left chest wall including left seventh rib deformity and chronic deformities of the left sixth and eighth ribs likely related  to prior thoracotomy. 3. Stable regions of scarring and architectural distortion involving the periphery of the left lower lobe. 4. Hiatal hernia with postsurgical changes of the distal thoracic esophagus and stomach. 5. Cholelithiasis without pericholecystic inflammation or visible ductal dilatation. 6. Nonobstructing left nephrolithiasis. 7. Aortic Atherosclerosis (ICD10-I70.0). Electronically Signed   By: Lovena Le M.D.   On: 06/09/2019 06:22   CT Cervical Spine Wo Contrast  Result Date: 06/09/2019 CLINICAL DATA:  Fall 2 days prior, left head neck pain EXAM: CT HEAD WITHOUT CONTRAST CT CERVICAL SPINE WITHOUT CONTRAST TECHNIQUE: Multidetector CT imaging of the head and cervical spine was performed following the standard protocol without intravenous contrast. Multiplanar CT image reconstructions of the cervical spine were also generated. COMPARISON:  CT head and cervical spine 03/18/2019 FINDINGS: CT HEAD FINDINGS Brain: No evidence of acute infarction, hemorrhage, hydrocephalus, extra-axial collection or mass lesion/mass effect. Symmetric prominence of the ventricles, cisterns and sulci compatible with parenchymal volume loss. Patchy areas of white matter hypoattenuation are most compatible with chronic microvascular angiopathy. Vascular: Atherosclerotic calcification of the carotid siphons and intradural vertebral arteries. No hyperdense vessel. Skull: No calvarial fracture or suspicious osseous lesion. No scalp swelling or hematoma. Sinuses/Orbits: Paranasal sinuses and mastoid air cells are predominantly clear. Orbital structures are unremarkable aside from prior lens extractions. Other: None CT CERVICAL SPINE FINDINGS Alignment: Preservation of the normal cervical lordosis. Mild anterolisthesis of C4 on C5 and retrolisthesis of C5 on C6 is unchanged  from comparison and likely on a degenerative basis. No traumatic listhesis. No abnormally widened, perched or jumped facets. Craniocervical atlantoaxial articulations are normally aligned. Skull base and vertebrae: Severe degenerative changes at the atlantodental interval with extensive sclerotic change. T2 superior endplate deformity is stable from prior. No acute vertebral body fracture or height loss. No skull base fracture is seen. Soft tissues and spinal canal: No pre or paravertebral fluid or swelling. No visible canal hematoma. Disc levels: Multilevel cervical spondylitic changes are present throughout the cervical spine including prominent degenerative change of the C1-2 articulation. There is cervical spondylosis with multilevel posterior disc osteophyte formation, uncinate spurring and facet hypertrophic change. These features are most pronounced from C5-T1 and result in at most mild canal stenosis at the C5-6 level. There is at least mild multilevel bilateral neural foraminal narrowing. Upper chest: No acute abnormality in the upper chest or imaged lung apices. Other: Normal thyroid. Cervical carotid atherosclerosis. IMPRESSION: 1. No acute intracranial abnormality. 2. Chronic microvascular angiopathy and parenchymal volume loss. 3. No acute cervical spine fracture or traumatic listhesis. 4. Stable multilevel cervical spondylitic changes as detailed above. 5. Stable remote T2 superior endplate deformity. 6. Cervical and intracranial atherosclerosis. Electronically Signed   By: Lovena Le M.D.   On: 06/09/2019 06:15   DG Chest Portable 1 View  Result Date: 06/09/2019 CLINICAL DATA:  Fall 2 days ago with left chest pain EXAM: PORTABLE CHEST 1 VIEW COMPARISON:  02/18/2017 FINDINGS: Chronic elevation of the left diaphragm with costophrenic sulcus blunting from scarring by 2012 CT. There is no edema, consolidation, effusion, or pneumothorax. Normal heart size and mediastinal contours. CABG. Remote left  rib fractures. IMPRESSION: 1. No acute finding. 2. Posttraumatic scarring at the left base. Electronically Signed   By: Monte Fantasia M.D.   On: 06/09/2019 05:34    EKG  EKG Interpretation  Date/Time:  Wednesday June 09 2019 04:34:47 EST Ventricular Rate:  61 PR Interval:    QRS Duration: 174 QT Interval:  495 QTC Calculation: 499 R Axis:   -  39 Text Interpretation: Sinus rhythm Prolonged PR interval Right bundle branch block Left ventricular hypertrophy Confirmed by Randal Buba, Evoleht Hovatter ([id number]) on 06/09/2019 4:36:53 AM       Radiology No results found.  Procedures Procedures (including critical care time)  Medications Ordered in ED Medications  ketorolac (TORADOL) 30 MG/ML injection 15 mg (has no administration in time range)  aspirin chewable tablet 324 mg (324 mg Oral Given 06/09/19 0447)  alum & mag hydroxide-simeth (MAALOX/MYLANTA) 200-200-20 MG/5ML suspension 30 mL (30 mLs Oral Given 06/09/19 0448)  acetaminophen (TYLENOL) tablet 1,000 mg (1,000 mg Oral Given 06/09/19 0453)   ED Course  I have reviewed the triage vital signs and the nursing notes.  Pertinent labs & imaging results that were available during my care of the patient were reviewed by me and considered in my medical decision making (see chart for details).    Patient is atypical for ACS.  EKG is reassuring.  I suspect this is all related to the fall and is a spasm.  Patient has been ruled out for PE in the ED along with head and neck trauma.  His covid is negative. There are no clinical or radiologic signs of pneumonia.    Patient's pain is down to a 4/10.  He is sleeping in the room post medication. Daughter reports patient is feeling a lot better with medication.    I have informed patient and family of CT results and chronic rib deformities on the left. I have explained there are no blood clots in the lungs and no pneumonia.  I have explained that we are waiting on a second set of cardiac enzymes and if  negative the patient may be discharged to home.    Final Clinical Impression(s) / ED Diagnoses  Signed out to Dr. Regenia Skeeter pending second troponin    Jerson Furukawa, MD 06/09/19 [phone]

## 2019-06-09 NOTE — ED Notes (Signed)
Family at bedside.ED Provider at bedside. 

## 2019-06-09 NOTE — Discharge Instructions (Addendum)
If you develop recurrent, continued, or worsening chest pain, shortness of breath, fever, vomiting, abdominal or back pain, or any other new/concerning symptoms then return to the ER for evaluation.  

## 2019-06-09 NOTE — ED Notes (Signed)
Pt transported to CT ?

## 2021-08-24 ENCOUNTER — Emergency Department (HOSPITAL_BASED_OUTPATIENT_CLINIC_OR_DEPARTMENT_OTHER): Payer: Medicare Other

## 2021-08-24 ENCOUNTER — Encounter (HOSPITAL_BASED_OUTPATIENT_CLINIC_OR_DEPARTMENT_OTHER): Payer: Self-pay

## 2021-08-24 ENCOUNTER — Emergency Department (HOSPITAL_BASED_OUTPATIENT_CLINIC_OR_DEPARTMENT_OTHER)
Admission: EM | Admit: 2021-08-24 | Discharge: 2021-08-25 | Disposition: A | Discharge status: Home / Self Care | Payer: Medicare Other | Attending: Emergency Medicine | Admitting: Emergency Medicine

## 2021-08-24 ENCOUNTER — Other Ambulatory Visit: Payer: Self-pay

## 2021-08-24 DIAGNOSIS — S4992XA Unspecified injury of left shoulder and upper arm, initial encounter: Secondary | ICD-10-CM | POA: Diagnosis present

## 2021-08-24 DIAGNOSIS — W19XXXA Unspecified fall, initial encounter: Secondary | ICD-10-CM | POA: Insufficient documentation

## 2021-08-24 DIAGNOSIS — Z7982 Long term (current) use of aspirin: Secondary | ICD-10-CM | POA: Diagnosis not present

## 2021-08-24 DIAGNOSIS — M79602 Pain in left arm: Secondary | ICD-10-CM

## 2021-08-24 NOTE — ED Triage Notes (Signed)
Patient states he fell last April 5th - was seen and told he had a cervical fracture - c-collar in place.  ?Patient states he is having left arm pain x 3 days and didn't think the last hospital got any imaging of his left arm. ?No new trauma reported - limited rom noted. ?

## 2021-08-25 ENCOUNTER — Other Ambulatory Visit: Payer: Self-pay

## 2021-08-25 MED ORDER — TRAMADOL HCL 50 MG PO TABS
50.0000 mg | ORAL_TABLET | Freq: Once | ORAL | Status: AC
  Administered 2021-08-25: 50 mg via ORAL
  Filled 2021-08-25: qty 1

## 2021-08-25 MED ORDER — TRAMADOL HCL 50 MG PO TABS: 50.0000 mg | ORAL_TABLET | Freq: Four times a day (QID) | ORAL | 0 refills | Status: DC | PRN

## 2021-08-25 NOTE — Discharge Instructions (Signed)
Please return if he has any new arm weakness, arm numbness, chest pain or shortness of breath in the next 2 to 3 days ?

## 2021-08-25 NOTE — ED Provider Notes (Signed)
?[facility] ?Provider Note ? ? ?CSN: [id number] ?Arrival date & time: 08/24/21  2246 ? ?  ? ?History ? ?Chief Complaint  ?Patient presents with  ? Arm Injury  ? ? ?JAXTEN BROSH is a 88 y.o. male. ? ?The history is provided by the patient and a relative.  ?Arm Injury ?Associated symptoms: no fever   ? ?  ?Patient presents with left arm pain.  He recently sustained a cervical spine fracture and has been wearing an [employer].  Over the past 3 days has had pain in his left upper arm.  No new trauma.  No weakness.  No chest pain or shortness of breath ?The pain comes and goes and worsens at night ?Home Medications ?Prior to Admission medications   ?Medication Sig Start Date End Date Taking? Authorizing Provider  ?traMADol (ULTRAM) 50 MG tablet Take 1 tablet (50 mg total) by mouth every 6 (six) hours as needed for severe pain. 08/25/21  Yes Ripley Fraise, MD  ?aspirin 81 MG tablet Take 81 mg by mouth daily.    [provider]  ?atorvastatin (LIPITOR) 80 MG tablet Take 80 mg by mouth daily.    [provider]  ?citalopram (CELEXA) 10 MG tablet Take 1 tablet (10 mg total) by mouth daily. 03/23/13   Lorretta Harp, MD  ?dextromethorphan-guaiFENesin [facility] DM) 30-600 MG per 12 hr tablet Take 1 tablet by mouth 2 (two) times daily.    [provider]  ?diclofenac Sodium (VOLTAREN) 1 % GEL Apply 4 g topically 4 (four) times daily. 06/09/19   Palumbo, April, MD  ?ferrous sulfate 325 (65 FE) MG tablet Take 1 tablet (325 mg total) by mouth 2 (two) times daily with a meal. <please make appointment> ?Patient not taking: Reported on 08/28/2017    Lorretta Harp, MD  ?gabapentin (NEURONTIN) 100 MG capsule Take 100 mg by mouth 2 (two) times daily.    [provider]  ?lidocaine (LIDODERM) 5 % Place 1 patch onto the skin daily. Remove & Discard patch within 12 hours or as directed by MD 06/09/19   Randal Buba, April, MD  ?midodrine (PROAMATINE) 5 MG tablet Take 5 mg  by mouth 2 (two) times daily with a meal.    [provider]  ?pantoprazole (PROTONIX) 40 MG tablet Take 1 tablet (40 mg total) by mouth 2 (two) times daily. <please schedule appointment for refills>    Lorretta Harp, MD  ?potassium chloride (K-DUR,KLOR-CON) 10 MEQ tablet Take 10 mEq by mouth 2 (two) times daily.    [provider]  ?pramipexole (MIRAPEX) 0.125 MG tablet Take 1 tablet (0.125 mg total) by mouth 3 (three) times daily. 08/25/13   Lorretta Harp, MD  ?   ? ?Allergies    ?Patient has no known allergies.   ? ?Review of Systems   ?Review of Systems  ?Constitutional:  Negative for fever.  ?Cardiovascular:  Negative for chest pain.  ?Musculoskeletal:  Positive for arthralgias.  ?Neurological:  Negative for weakness.  ? ?Physical Exam ?Updated Vital Signs ?BP (!) 166/69   Pulse 60   Temp 97.6 ?F (36.4 ?C) (Oral)   Resp 16   Ht 1.702 m ('5\' 7"'$ )   Wt 70.3 kg   SpO2 96%   BMI 24.28 kg/m?  ?Physical Exam ?CONSTITUTIONAL: Elderly, no acute distress ?HEAD: Normocephalic/atraumatic ?EYES: EOMI ?ENMT: Mucous membranes moist ?NECK: Aspen cervical collar in place ?CV: S1/S2 noted ?NEURO: Pt is awake/alert/appropriate, moves all extremitiesx4.  No facial  droop.   ?Equal power (5/5) with hand grip, wrist flex/extension, elbow flex/extension, and equal power with shoulder abduction/adduction.  No focal sensory deficit to light touch is noted in either UE.   ?Equal (2+) biceps/brachioradialis reflex in bilateral UE ?EXTREMITIES: pulses normal/equal, full ROM, mild tenderness of the left shoulder, no bruising, no erythema, no warmth, no edema ?He has full range of motion of left shoulder he can lift his arm above his head ?Distal pulses intact ?SKIN: warm, color normal ?PSYCH: no abnormalities of mood noted, alert and oriented to situation ? ?ED Results / Procedures / Treatments   ?Labs ?(all labs ordered are listed, but only abnormal results are displayed) ?Labs Reviewed - No data to  display ? ?EKG ?None ? ?Radiology ?DG Shoulder Left ? ?Result Date: 08/24/2021 ?CLINICAL DATA:  Left arm pain EXAM: LEFT SHOULDER - 2+ VIEW COMPARISON:  None. FINDINGS: No fracture or dislocation is seen. The joint spaces are preserved. Visualized soft tissues are within normal limits. Visualized left lung is clear. IMPRESSION: Negative. Electronically Signed   By: Julian Hy M.D.   On: 08/24/2021 23:24   ? ?Procedures ?Procedures  ? ? ?Medications Ordered in ED ?Medications - No data to display ? ?ED Course/ Medical Decision Making/ A&P ?  ?                        ?Medical Decision Making ?Amount and/or Complexity of Data Reviewed ?Radiology: ordered. ?ECG/medicine tests: ordered. ? ?Risk ?Prescription drug management. ? ? ?Patient presents with left arm pain.  I reviewed external records which reveal the patient was seen in outside hospital on April 14 after a mechanical fall.  Patient had a negative CT head but had a nondisplaced cervical fracture as well as thoracic compression fractures.  He was recommended to wear an Aspen collar for 3 months and follow-up with orthopedics/spine ?Patient reports of the past 3 days he has had increasing pain in the left arm but no new trauma. ?Pain comes and goes and worse at night. ?No chest pain to suggest ACS, and his EKG is negative ?He has good strength and no deformities are noted  ?I personally visualized the x-ray of his left shoulder it is negative ?Patient appears to be having some pain due to the Aspen collar. ?Appropriate use of the collar was provided to patient and daughter ?He has tolerated tramadol in the past, this will be refilled.  He will be referred to PCP. ? ? ? ? ? ? ? ?Final Clinical Impression(s) / ED Diagnoses ?Final diagnoses:  ?Left arm pain  ? ? ?Rx / DC Orders ?ED Discharge Orders   ? ?      Ordered  ?  traMADol (ULTRAM) 50 MG tablet  Every 6 hours PRN       ? 08/25/21 0049  ? ?  ?  ? ?  ? ? ?  ?Ripley Fraise, MD ?08/25/21 [phone] ? ?

## 2021-08-25 NOTE — ED Notes (Signed)
Pt wheeled out of ED via wheelchair. Pt and daughter verbalized understanding of d/c instructions, prescription and follow up care. ?

## 2022-04-29 DEATH — deceased
